# Patient Record
Sex: Male | Born: 1950 | Race: White | Hispanic: No | Marital: Married | Smoking: Former smoker
Health system: Southern US, Community
[De-identification: ages and names within clinical notes are randomized; demographics above are authoritative.]

## PROBLEM LIST (undated history)

## (undated) DIAGNOSIS — K219 Gastro-esophageal reflux disease without esophagitis: Secondary | ICD-10-CM

## (undated) DIAGNOSIS — J189 Pneumonia, unspecified organism: Secondary | ICD-10-CM

## (undated) HISTORY — PX: VASECTOMY: SHX75

## (undated) HISTORY — PX: COLONOSCOPY: SHX174

## (undated) HISTORY — PX: ROTATOR CUFF REPAIR: SHX139

---

## 2006-11-19 ENCOUNTER — Emergency Department: Payer: Self-pay | Admitting: Emergency Medicine

## 2007-01-07 ENCOUNTER — Ambulatory Visit: Payer: Self-pay | Admitting: Internal Medicine

## 2007-01-18 ENCOUNTER — Ambulatory Visit: Payer: Self-pay | Admitting: Internal Medicine

## 2010-11-06 HISTORY — PX: SHOULDER SURGERY: SHX246

## 2011-12-06 ENCOUNTER — Encounter: Payer: Self-pay | Admitting: Internal Medicine

## 2012-09-05 ENCOUNTER — Encounter: Payer: Self-pay | Admitting: Internal Medicine

## 2012-10-11 ENCOUNTER — Ambulatory Visit (AMBULATORY_SURGERY_CENTER): Payer: Medicare FFS

## 2012-10-11 VITALS — Ht 69.75 in | Wt 172.0 lb

## 2012-10-11 DIAGNOSIS — Z860100 Personal history of colon polyps, unspecified: Secondary | ICD-10-CM

## 2012-10-11 MED ORDER — MOVIPREP 100 G PO SOLR: 1.0000 | Freq: Once | ORAL | Status: DC

## 2012-10-17 ENCOUNTER — Ambulatory Visit (AMBULATORY_SURGERY_CENTER): Payer: Medicare FFS | Admitting: Internal Medicine

## 2012-10-17 ENCOUNTER — Encounter: Payer: Self-pay | Admitting: Internal Medicine

## 2012-10-17 VITALS — BP 121/77 | HR 63 | Temp 97.7°F | Resp 13 | Ht 69.75 in | Wt 172.0 lb

## 2012-10-17 DIAGNOSIS — D126 Benign neoplasm of colon, unspecified: Secondary | ICD-10-CM

## 2012-10-17 DIAGNOSIS — Z86010 Personal history of colonic polyps: Secondary | ICD-10-CM

## 2012-10-17 DIAGNOSIS — Z1211 Encounter for screening for malignant neoplasm of colon: Secondary | ICD-10-CM

## 2012-10-17 MED ORDER — SODIUM CHLORIDE 0.9 % IV SOLN: 500.0000 mL | INTRAVENOUS | Status: DC

## 2012-10-17 NOTE — Patient Instructions (Addendum)
YOU HAD AN ENDOSCOPIC PROCEDURE TODAY AT THE [facility]: Refer to the procedure report that was given to you for any specific questions about what was found during the examination.  If the procedure report does not answer your questions, please call your gastroenterologist to clarify.  If you requested that your care partner not be given the details of your procedure findings, then the procedure report has been included in a sealed envelope for you to review at your convenience later.  YOU SHOULD EXPECT: Some feelings of bloating in the abdomen. Passage of more gas than usual.  Walking can help get rid of the air that was put into your GI tract during the procedure and reduce the bloating. If you had a lower endoscopy (such as a colonoscopy or flexible sigmoidoscopy) you may notice spotting of blood in your stool or on the toilet paper. If you underwent a bowel prep for your procedure, then you may not have a normal bowel movement for a few days.  DIET: Your first meal following the procedure should be a light meal and then it is ok to progress to your normal diet.  A half-sandwich or bowl of soup is an example of a good first meal.  Heavy or fried foods are harder to digest and may make you feel nauseous or bloated.  Likewise meals heavy in dairy and vegetables can cause extra gas to form and this can also increase the bloating.  Drink plenty of fluids but you should avoid alcoholic beverages for 24 hours.  ACTIVITY: Your care partner should take you home directly after the procedure.  You should plan to take it easy, moving slowly for the rest of the day.  You can resume normal activity the day after the procedure however you should NOT DRIVE or use heavy machinery for 24 hours (because of the sedation medicines used during the test).    SYMPTOMS TO REPORT IMMEDIATELY: A gastroenterologist can be reached at any hour.  During normal business hours, 8:30 AM to 5:00 PM Monday through Friday,  call [phone].  After hours and on weekends, please call the GI answering service at [phone] who will take a message and have the physician on call contact you.   Following lower endoscopy (colonoscopy or flexible sigmoidoscopy):  Excessive amounts of blood in the stool  Significant tenderness or worsening of abdominal pains  Swelling of the abdomen that is new, acute  Fever of 100F or higher   FOLLOW UP: If any biopsies were taken you will be contacted by phone or by letter within the next 1-3 weeks.  Call your gastroenterologist if you have not heard about the biopsies in 3 weeks.  Our staff will call the home number listed on your records the next business day following your procedure to check on you and address any questions or concerns that you may have at that time regarding the information given to you following your procedure. This is a courtesy call and so if there is no answer at the home number and we have not heard from you through the emergency physician on call, we will assume that you have returned to your regular daily activities without incident.  SIGNATURES/CONFIDENTIALITY: You and/or your care partner have signed paperwork which will be entered into your electronic medical record.  These signatures attest to the fact that that the information above on your After Visit Summary has been reviewed and is understood.  Full responsibility of the confidentiality of   this discharge information lies with you and/or your care-partner.   Resume medications. Information given on polyps,diverticulosis and high fiber diet with discharge instructions. 

## 2012-10-17 NOTE — Op Note (Signed)
[facility] [address] [place], [address]   COLONOSCOPY PROCEDURE REPORT  PATIENT: Charles Velazquez, Charles Velazquez  MR#: [id number] BIRTHDATE: 06/17/1951 , 61  yrs. old GENDER: Male ENDOSCOPIST: Roxy Cedar, MD REFERRED XB:[id number] Program Recall PROCEDURE DATE:  10/17/2012 PROCEDURE:   Colonoscopy with snare polypectomy ASA CLASS:   Class II INDICATIONS:Patient's personal history of adenomatous colon polyps.  MEDICATIONS: MAC sedation, administered by CRNA and propofol (Diprivan) 400mg  IV  DESCRIPTION OF PROCEDURE:   After the risks benefits and alternatives of the procedure were thoroughly explained, informed consent was obtained.  A digital rectal exam revealed no abnormalities of the rectum.   The LB CF-[id number]  endoscope was introduced through the anus and advanced to the cecum, which was identified by both the appendix and ileocecal valve. No adverse events experienced.   The quality of the prep was excellent, using MoviPrep  The instrument was then slowly withdrawn as the colon was fully examined.      COLON FINDINGS: Two polyps measuring 9 and 5 mm in size were found in the transverse colon and rectum respectively.  A polypectomy was performed with a cold snare.  The resection was complete and the polyp tissue was completely retrieved.   Moderate diverticulosis was noted in the sigmoid colon.   The colon mucosa was otherwise normal.  Retroflexed views revealed no abnormalities. The time to cecum=3 minutes 53 seconds.  Withdrawal time=11 minutes 0 seconds. The scope was withdrawn and the procedure completed. COMPLICATIONS: There were no complications.  ENDOSCOPIC IMPRESSION: 1.   Two polyps were found in the transverse colon and rectum; polypectomy was performed with a cold snare 2.   Moderate diverticulosis was noted in the sigmoid colon 3.   The colon mucosa was otherwise normal  RECOMMENDATIONS: 1. Follow up colonoscopy in 5  years   eSigned:  Roxy Cedar, MD 10/17/2012 3:50 PM   cc: Robert Bellow, MD and The Patient   PATIENT NAME:  Charles Velazquez, Charles Velazquez MR#: [id number]

## 2012-10-17 NOTE — Progress Notes (Signed)
Propofol given over incremental dosages 

## 2012-10-17 NOTE — Progress Notes (Signed)
Patient did not experience any of the following events: a burn prior to discharge; a fall within the facility; wrong site/side/patient/procedure/implant event; or a hospital transfer or hospital admission upon discharge from the facility. (G8907) Patient did not have preoperative order for IV antibiotic SSI prophylaxis. (G8918)  

## 2012-10-17 NOTE — Progress Notes (Signed)
Called to room to assist during endoscopic procedure.  Patient ID and intended procedure confirmed with present staff. Received instructions for my participation in the procedure from the performing physician. ewm 

## 2012-10-18 ENCOUNTER — Telehealth: Payer: Self-pay

## 2012-10-18 NOTE — Telephone Encounter (Signed)
Left message on answering machine. 

## 2012-10-23 ENCOUNTER — Encounter: Payer: Self-pay | Admitting: Internal Medicine

## 2012-11-23 ENCOUNTER — Ambulatory Visit (INDEPENDENT_AMBULATORY_CARE_PROVIDER_SITE_OTHER): Payer: Managed Care, Other (non HMO) | Admitting: Family Medicine

## 2012-11-23 ENCOUNTER — Ambulatory Visit: Payer: Managed Care, Other (non HMO)

## 2012-11-23 VITALS — BP 131/80 | HR 70 | Temp 98.1°F | Resp 18 | Ht 69.0 in | Wt 169.6 lb

## 2012-11-23 DIAGNOSIS — R05 Cough: Secondary | ICD-10-CM

## 2012-11-23 DIAGNOSIS — R059 Cough, unspecified: Secondary | ICD-10-CM

## 2012-11-23 DIAGNOSIS — J189 Pneumonia, unspecified organism: Secondary | ICD-10-CM

## 2012-11-23 DIAGNOSIS — J40 Bronchitis, not specified as acute or chronic: Secondary | ICD-10-CM

## 2012-11-23 LAB — POCT CBC
Granulocyte percent: 73 %G (ref 37–80)
HCT, POC: 46.9 % (ref 43.5–53.7)
Hemoglobin: 14.4 g/dL (ref 14.1–18.1)
Lymph, poc: 2 (ref 0.6–3.4)
MCH, POC: 29.8 pg (ref 27–31.2)
MCHC: 30.7 g/dL — AB (ref 31.8–35.4)
MCV: 97.2 fL — AB (ref 80–97)
MID (cbc): 0.7 (ref 0–0.9)
MPV: 8.2 fL (ref 0–99.8)
POC Granulocyte: 7.2 — AB (ref 2–6.9)
POC LYMPH PERCENT: 19.8 %L (ref 10–50)
POC MID %: 7.2 %M (ref 0–12)
Platelet Count, POC: 397 10*3/uL (ref 142–424)
RBC: 4.83 M/uL (ref 4.69–6.13)
RDW, POC: 12.9 %
WBC: 9.9 10*3/uL (ref 4.6–10.2)

## 2012-11-23 MED ORDER — HYDROCODONE-HOMATROPINE 5-1.5 MG/5ML PO SYRP: 5.0000 mL | ORAL_SOLUTION | Freq: Three times a day (TID) | ORAL | Status: DC | PRN

## 2012-11-23 MED ORDER — AZITHROMYCIN 250 MG PO TABS: ORAL_TABLET | ORAL | Status: DC

## 2012-11-23 MED ORDER — PREDNISONE 20 MG PO TABS: ORAL_TABLET | ORAL | Status: DC

## 2012-11-23 NOTE — Patient Instructions (Addendum)

## 2012-11-23 NOTE — Progress Notes (Signed)
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 First he thought was an upper respiratory infection and did not need much in with medicine. About 5 days ago he developed intermittent fever and worsening cough which has prompted him to quit smoking. The cough is now fairly violent, he's short of breath, and tired. The fever has stopped of the last 3 days. Is not coughing much phlegm up and he seen no hemoptysis.  Objective: Patient appears ill but in no acute distress. He is cooperative and has good eye contact HEENT: Unremarkable Neck: Supple no adenopathy Chest: Bibasilar rales and rhonchi Heart: Regular without murmur Extremities: No edema [facility] reading (PRIMARY) by  Dr. Milus Glazier:  CXR  Flattened diaphragms c/w COPD Results for orders placed in visit on 11/23/12  POCT CBC      Component Value Range   WBC 9.9  4.6 - 10.2 K/uL   Lymph, poc 2.0  0.6 - 3.4   POC LYMPH PERCENT 19.8  10 - 50 %L   MID (cbc) 0.7  0 - 0.9   POC MID % 7.2  0 - 12 %M   POC Granulocyte 7.2 (*) 2 - 6.9   Granulocyte percent 73.0  37 - 80 %G   RBC 4.83  4.69 - 6.13 M/uL   Hemoglobin 14.4  14.1 - 18.1 g/dL   HCT, POC 16.1  09.6 - 53.7 %   MCV 97.2 (*) 80 - 97 fL   MCH, POC 29.8  27 - 31.2 pg   MCHC 30.7 (*) 31.8 - 35.4 g/dL   RDW, POC 04.5     Platelet Count, POC 397  142 - 424 K/uL   MPV 8.2  0 - 99.8 fL  \ .  Assessment:  COPD with bronchitis, acute.  Worsening.  Judging by the x-ray and lung sounds, I suspect the patient has an early pneumonia.  1. Cough  DG Chest 2 View, POCT CBC, azithromycin (ZITHROMAX Z-PAK) 250 MG tablet, predniSONE (DELTASONE) 20 MG tablet, HYDROcodone-homatropine (HYCODAN) 5-1.5 MG/5ML syrup  2. Bronchitis  azithromycin (ZITHROMAX Z-PAK) 250 MG tablet, predniSONE (DELTASONE) 20 MG tablet, HYDROcodone-homatropine (HYCODAN) 5-1.5 MG/5ML syrup  3. Pneumonia  azithromycin (ZITHROMAX Z-PAK) 250 MG tablet   Return INB in 48 hours, sooner if worsening

## 2012-12-04 ENCOUNTER — Ambulatory Visit: Payer: Managed Care, Other (non HMO)

## 2012-12-04 ENCOUNTER — Ambulatory Visit (INDEPENDENT_AMBULATORY_CARE_PROVIDER_SITE_OTHER): Payer: Managed Care, Other (non HMO) | Admitting: Emergency Medicine

## 2012-12-04 VITALS — BP 149/78 | HR 79 | Temp 98.6°F | Resp 16 | Ht 69.0 in | Wt 169.9 lb

## 2012-12-04 DIAGNOSIS — R918 Other nonspecific abnormal finding of lung field: Secondary | ICD-10-CM

## 2012-12-04 DIAGNOSIS — R9389 Abnormal findings on diagnostic imaging of other specified body structures: Secondary | ICD-10-CM

## 2012-12-04 DIAGNOSIS — F17200 Nicotine dependence, unspecified, uncomplicated: Secondary | ICD-10-CM

## 2012-12-04 NOTE — Progress Notes (Signed)
  Subjective:    Patient ID: Charles Velazquez, male    DOB: [birth year], 62 y.o.   MRN: [id number]  HPI patient seen here with a respiratory illness which was treated with prednisone Hycodan and Zithromax. His chest x-ray showed a questionable nodular area. HEENT it was requested that he come in for repeat chest x-ray with nipple markers. He is a one pack per day smoker.    Review of Systems     Objective:   Physical Exam his chest is clear to auscultation and percussion today. He does not appear ill.  [facility] reading (PRIMARY) by  Dr. Cleta Alberts I do not see the nodule on this view.        Assessment & Plan:  Patient is doing well from his respiratory illness. He was again encouraged to not smoke. I sent his chest x-ray to the radiologist for over read. If the reading on his chest x-ray is normal I would suggest a repeat chest x-ray in 3-6 months just to be 100% sure there is nothing active in that area.

## 2012-12-13 ENCOUNTER — Telehealth: Payer: Self-pay | Admitting: Radiology

## 2012-12-13 NOTE — Telephone Encounter (Signed)
Message copied by Caffie Damme on Fri Dec 13, 2012 10:45 AM ------      Message from: Lesle Chris A      Created: Wed Dec 04, 2012 12:45 PM       Please call patient and let him know that they do feel the nodular density is a nipple shadow. I would recommend he have a chest x-ray in 6 months just to be absolutely sure

## 2012-12-13 NOTE — Telephone Encounter (Signed)
Called patient to advise  °

## 2013-06-10 ENCOUNTER — Other Ambulatory Visit: Payer: Managed Care, Other (non HMO) | Admitting: Internal Medicine

## 2013-06-10 DIAGNOSIS — Z125 Encounter for screening for malignant neoplasm of prostate: Secondary | ICD-10-CM

## 2013-06-10 DIAGNOSIS — Z130 Encounter for screening for diseases of the blood and blood-forming organs and certain disorders involving the immune mechanism: Secondary | ICD-10-CM

## 2013-06-10 DIAGNOSIS — Z0000 Encounter for general adult medical examination without abnormal findings: Secondary | ICD-10-CM

## 2013-06-10 DIAGNOSIS — Z13220 Encounter for screening for lipoid disorders: Secondary | ICD-10-CM

## 2013-06-10 LAB — CBC WITH DIFFERENTIAL/PLATELET
Basophils Absolute: 0 10*3/uL (ref 0.0–0.1)
Eosinophils Relative: 4 % (ref 0–5)
Lymphocytes Relative: 30 % (ref 12–46)
Neutro Abs: 3.6 10*3/uL (ref 1.7–7.7)
Neutrophils Relative %: 58 % (ref 43–77)
Platelets: 222 10*3/uL (ref 150–400)
RDW: 14.1 % (ref 11.5–15.5)
WBC: 6.1 10*3/uL (ref 4.0–10.5)

## 2013-06-11 LAB — COMPREHENSIVE METABOLIC PANEL
ALT: 13 U/L (ref 0–53)
AST: 27 U/L (ref 0–37)
Calcium: 9 mg/dL (ref 8.4–10.5)
Chloride: 104 mEq/L (ref 96–112)
Creat: 1.01 mg/dL (ref 0.50–1.35)
Potassium: 4.1 mEq/L (ref 3.5–5.3)
Sodium: 138 mEq/L (ref 135–145)

## 2013-06-11 LAB — LIPID PANEL
HDL: 57 mg/dL (ref >39)
LDL Cholesterol: 98 mg/dL (ref 0–99)

## 2013-06-16 ENCOUNTER — Ambulatory Visit (INDEPENDENT_AMBULATORY_CARE_PROVIDER_SITE_OTHER): Payer: Managed Care, Other (non HMO) | Admitting: Internal Medicine

## 2013-06-16 ENCOUNTER — Encounter: Payer: Self-pay | Admitting: Internal Medicine

## 2013-06-16 VITALS — BP 142/78 | HR 80 | Temp 98.7°F | Ht 68.75 in | Wt 171.0 lb

## 2013-06-16 DIAGNOSIS — Z0000 Encounter for general adult medical examination without abnormal findings: Secondary | ICD-10-CM

## 2013-06-16 DIAGNOSIS — Z8709 Personal history of other diseases of the respiratory system: Secondary | ICD-10-CM | POA: Insufficient documentation

## 2013-06-16 DIAGNOSIS — Z87891 Personal history of nicotine dependence: Secondary | ICD-10-CM

## 2013-06-16 LAB — POCT URINALYSIS DIPSTICK
Bilirubin, UA: NEGATIVE
Ketones, UA: NEGATIVE
Leukocytes, UA: NEGATIVE
Protein, UA: NEGATIVE
Spec Grav, UA: 1.01
pH, UA: 7

## 2013-07-07 ENCOUNTER — Encounter: Payer: Self-pay | Admitting: Internal Medicine

## 2013-07-07 NOTE — Patient Instructions (Addendum)
Please quit smoking. May use albuterol inhaler for cough as needed. Return in one year or as needed.

## 2013-07-07 NOTE — Progress Notes (Signed)
Subjective:    Patient ID: Charles Velazquez, male    DOB: 1951-01-24, 62 y.o.   MRN: [id number]  HPI First visit for this pleasant 62 year old white male referred by Parke Poisson for health maintenance and evaluation of medical issues.   No known drug allergies  No chronic medications  Had tetanus immunization 2012  History of colonoscopy by Dr. Marina Goodell with hyperplastic and adenomatous polyps December 2013.  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 He is a native of [place] but raised in [place] and attended high school in [place] before moving to [place]. Moved to [facility] 1978 with wife and 1-year-old daughter. Long-standing history of heavy smoking a pack per day for 40 years. Drinks beer generally about 32 ounces daily. Married. Wife employed by [employer]  Family history: Both parents age 81 living in good health. One brother age 60 with history of gout. 2 sisters in good health. 3 adult daughters in good health.  Patient was seen in urgent care Center January 20 14th with cough. Chest x-ray raise question of possible abnormal chest x-ray with lung nodule. Have her chest x-ray was repeated with nipple markers and area in question corresponded with nipple marker    Review of Systems  Constitutional: Negative.   HENT: Negative.   Eyes: Negative.   Respiratory:       Some cough and shortness of breath  Cardiovascular: Negative.   Gastrointestinal: Negative.   Endocrine: Negative.   Genitourinary: Negative.   Musculoskeletal:       Minor aches and pains  Allergic/Immunologic: Negative.   Neurological: Negative.   Hematological: Negative.   Psychiatric/Behavioral: Negative.        Objective:   Physical Exam  Vitals  reviewed. Constitutional: He is oriented to person, place, and time. He appears well-developed and well-nourished. No distress.  HENT:  Head: Normocephalic and atraumatic.  Right Ear: External ear normal.  Left Ear: External ear normal.  Mouth/Throat: Oropharynx is clear and moist. No oropharyngeal exudate.  Eyes: Conjunctivae and EOM are normal. Right eye exhibits no discharge. Left eye exhibits no discharge.  Neck: Neck supple. No JVD present. No tracheal deviation present. No thyromegaly present.  Cardiovascular: Normal rate, regular rhythm, normal heart sounds and intact distal pulses.   No murmur heard. Pulmonary/Chest: Effort normal and breath sounds normal. No respiratory distress. He has no wheezes. He has no rales. He exhibits no tenderness.  Abdominal: Soft. Bowel sounds are normal. He exhibits no distension and no mass. There is no tenderness. There is no rebound and no guarding.  Genitourinary: Prostate normal.  Musculoskeletal: Normal range of motion. He exhibits no edema.  Lymphadenopathy:    He has no cervical adenopathy.  Neurological: He is oriented to person, place, and time. He has normal reflexes. He displays normal reflexes. No cranial nerve deficit. Coordination normal.  Skin: Skin is warm and dry. No rash noted. He is not diaphoretic.  Psychiatric: He has a normal mood and affect. His behavior is normal. Judgment and thought content normal.          Assessment & Plan:  History of smoking  COPD  History of adenomatous colon polyps on colonoscopy 2013  Plan: Patient willing to try Chantix. Prescription written. Has chronic cough consistent with COPD/chronic bronchitis. Recommend albuterol inhaler 2 sprays by  mouth 4 times daily as needed

## 2014-04-16 ENCOUNTER — Ambulatory Visit (INDEPENDENT_AMBULATORY_CARE_PROVIDER_SITE_OTHER): Payer: Managed Care, Other (non HMO) | Admitting: Family Medicine

## 2014-04-16 VITALS — BP 128/82 | HR 81 | Temp 97.9°F | Resp 16 | Ht 69.0 in | Wt 172.4 lb

## 2014-04-16 DIAGNOSIS — M542 Cervicalgia: Secondary | ICD-10-CM

## 2014-04-16 DIAGNOSIS — IMO0001 Reserved for inherently not codable concepts without codable children: Secondary | ICD-10-CM

## 2014-04-16 DIAGNOSIS — B9789 Other viral agents as the cause of diseases classified elsewhere: Secondary | ICD-10-CM

## 2014-04-16 DIAGNOSIS — M791 Myalgia, unspecified site: Secondary | ICD-10-CM

## 2014-04-16 DIAGNOSIS — R51 Headache: Secondary | ICD-10-CM

## 2014-04-16 DIAGNOSIS — B349 Viral infection, unspecified: Secondary | ICD-10-CM

## 2014-04-16 LAB — POCT CBC
GRANULOCYTE PERCENT: 59.2 % (ref 37–80)
HEMATOCRIT: 50.1 % (ref 43.5–53.7)
HEMOGLOBIN: 16.1 g/dL (ref 14.1–18.1)
LYMPH, POC: 1 (ref 0.6–3.4)
MCH, POC: 31.4 pg — AB (ref 27–31.2)
MCHC: 32.1 g/dL (ref 31.8–35.4)
MCV: 97.8 fL — AB (ref 80–97)
MID (cbc): 0.3 (ref 0–0.9)
MPV: 8.4 fL (ref 0–99.8)
POC GRANULOCYTE: 2 (ref 2–6.9)
POC LYMPH PERCENT: 31.8 %L (ref 10–50)
POC MID %: 9 %M (ref 0–12)
Platelet Count, POC: 194 10*3/uL (ref 142–424)
RBC: 5.12 M/uL (ref 4.69–6.13)
RDW, POC: 13.7 %
WBC: 3.3 10*3/uL — AB (ref 4.6–10.2)

## 2014-04-16 MED ORDER — HYDROCODONE-ACETAMINOPHEN 5-325 MG PO TABS: ORAL_TABLET | ORAL | Status: DC

## 2014-04-16 MED ORDER — DICLOFENAC SODIUM 75 MG PO TBEC: DELAYED_RELEASE_TABLET | ORAL | Status: DC

## 2014-04-16 NOTE — Addendum Note (Signed)
Addended by: Ivor Reining on: 04/16/2014 10:20 AM   Modules accepted: Orders

## 2014-04-16 NOTE — Progress Notes (Addendum)
Subjective: Patient started feeling bad 4 days ago, Sunday night. He developed a headache and neck pain and severe generalized aching. He felt lousy all over. No dizziness. No nausea or vomiting. No diarrhea or constipation except he had some constipation earlier in the week. He did not have any cold or cough or sore throat. The head is continued intermittently having severe pain. There is a quite in the right upper neck that he can touch which has been severely tender. He has done just a little bit of work this week. He works Architect. He was bothering him bad enough again last night and decide him, and this morning. He still is smaller.  Objective: Pleasant alert gentleman. TMs are normal. Eyes PERRLA. Fundi benign. EOMs intact. Throat clear. Neck supple without nodes. No carotid bruits. Cranial nerves 2-12 grossly intact. He does have a tender spot in the neck to the right of the C-spine and about the C1-2 level. Chest clear. Heart regular without murmurs. Abdomen soft the mass or tenderness. Strength is good.  Assessment: Headache Cervical pain Generalized malaise and achiness  Plan: Check CBC  Results for orders placed in visit on 04/16/14  POCT CBC      Result Value Ref Range   WBC 3.3 (*) 4.6 - 10.2 K/uL   Lymph, poc 1.0  0.6 - 3.4   POC LYMPH PERCENT 31.8  10 - 50 %L   MID (cbc) 0.3  0 - 0.9   POC MID % 9.0  0 - 12 %M   POC Granulocyte 2.0  2 - 6.9   Granulocyte percent 59.2  37 - 80 %G   RBC 5.12  4.69 - 6.13 M/uL   Hemoglobin 16.1  14.1 - 18.1 g/dL   HCT, POC 50.1  43.5 - 53.7 %   MCV 97.8 (*) 80 - 97 fL   MCH, POC 31.4 (*) 27 - 31.2 pg   MCHC 32.1  31.8 - 35.4 g/dL   RDW, POC 13.7     Platelet Count, POC 194  142 - 424 K/uL   MPV 8.4  0 - 99.8 fL   My computer is not printing prescriptions this morning so we had to read print thehydrocodone prescription. It looks like to prescriptions were given but only one was.

## 2014-11-06 ENCOUNTER — Ambulatory Visit (INDEPENDENT_AMBULATORY_CARE_PROVIDER_SITE_OTHER): Payer: Managed Care, Other (non HMO)

## 2014-11-06 ENCOUNTER — Ambulatory Visit (INDEPENDENT_AMBULATORY_CARE_PROVIDER_SITE_OTHER): Payer: Managed Care, Other (non HMO) | Admitting: Family Medicine

## 2014-11-06 VITALS — BP 154/86 | HR 85 | Temp 97.9°F | Resp 18 | Ht 68.75 in | Wt 179.2 lb

## 2014-11-06 DIAGNOSIS — R0781 Pleurodynia: Secondary | ICD-10-CM

## 2014-11-06 DIAGNOSIS — F17200 Nicotine dependence, unspecified, uncomplicated: Secondary | ICD-10-CM

## 2014-11-06 DIAGNOSIS — S2232XA Fracture of one rib, left side, initial encounter for closed fracture: Secondary | ICD-10-CM

## 2014-11-06 DIAGNOSIS — Z720 Tobacco use: Secondary | ICD-10-CM

## 2014-11-06 DIAGNOSIS — J449 Chronic obstructive pulmonary disease, unspecified: Secondary | ICD-10-CM

## 2014-11-06 MED ORDER — OXYCODONE-ACETAMINOPHEN 5-325 MG PO TABS: 1.0000 | ORAL_TABLET | ORAL | Status: DC | PRN

## 2014-11-06 MED ORDER — MELOXICAM 15 MG PO TABS: 15.0000 mg | ORAL_TABLET | Freq: Every day | ORAL | Status: DC

## 2014-11-06 NOTE — Patient Instructions (Signed)

## 2014-11-06 NOTE — Progress Notes (Signed)
Subjective:    Patient ID: Charles Velazquez, male    DOB: 1951/06/22, 64 y.o.   MRN: [id number] Chief Complaint  Patient presents with  . Chest Pain    left rib pain. slipped on Monday and hit the left side of his ribs  . Shortness of Breath    HPI  On Monday - 5 d prev - pt was in the bed of his pick-up truck when he fell and landed on his side hitting his left lower lateral chest. Has had similar rib injuries in that area prior so was trying to take ibuprofen bid but today when he woke up the pain was much worse than initial and he felt short of breath.  He has not taken any medications today.  Has some swelling and a little bruising over the area.   No past medical history on file. Current Outpatient Prescriptions on File Prior to Visit  Medication Sig Dispense Refill  . aspirin 325 MG tablet Take 325 mg by mouth every 6 (six) hours as needed for pain.    Marland Kitchen diclofenac (VOLTAREN) 75 MG EC tablet Take one twice daily for neck pain and inflammation (Patient not taking: Reported on 11/06/2014) 20 tablet 0  . Multiple Vitamin (MULTIVITAMIN) tablet Take 1 tablet by mouth daily.     No current facility-administered medications on file prior to visit.   No Known Allergies   Review of Systems  Constitutional: Positive for activity change, appetite change and fatigue. Negative for fever and chills.  HENT: Negative for congestion, ear discharge, ear pain, postnasal drip, rhinorrhea, sinus pressure, sneezing, sore throat and trouble swallowing.   Respiratory: Positive for cough, chest tightness, shortness of breath and wheezing.   Cardiovascular: Positive for chest pain.  Gastrointestinal: Negative for nausea, vomiting, abdominal pain, diarrhea and constipation.  Genitourinary: Negative for dysuria and difficulty urinating.  Musculoskeletal: Positive for myalgias and arthralgias.  Skin: Positive for color change. Negative for rash and wound.  Hematological: Negative for adenopathy.    Psychiatric/Behavioral: Positive for sleep disturbance.       Objective:  BP 154/86 mmHg  Pulse 85  Temp(Src) 97.9 F (36.6 C) (Oral)  Resp 18  Ht 5' 8.75" (1.746 m)  Wt 179 lb 3.2 oz (81.285 kg)  BMI 26.66 kg/m2  SpO2 95%  Physical Exam  Constitutional: He is oriented to person, place, and time. He appears well-developed and well-nourished. No distress.  HENT:  Head: Normocephalic and atraumatic.  Eyes: Pupils are equal, round, and reactive to light.  Cardiovascular: Normal rate, regular rhythm, normal heart sounds and intact distal pulses.   No murmur heard. Pulmonary/Chest: Effort normal and breath sounds normal. No respiratory distress. He has no wheezes. He has no rales. He exhibits tenderness.  Musculoskeletal: Normal range of motion. He exhibits tenderness. He exhibits no edema.  Neurological: He is alert and oriented to person, place, and time. He has normal strength. He displays no atrophy. No sensory deficit. He exhibits normal muscle tone. Coordination and gait normal.  Skin: Skin is warm and dry. No rash noted. He is not diaphoretic. No erythema.  Psychiatric: He has a normal mood and affect. His behavior is normal.         [facility] reading (PRIMARY) by  Dr. Brigitte Pulse. CXR: No acute abnormality; underlying copd with coarsening of interstitial marking bilaterally Left lateral rib films: no rib fracture seen  Dg Chest 2 View  11/06/2014   CLINICAL DATA:  Shortness of breath, smoker.  EXAM: CHEST  2 VIEW  COMPARISON:  12/04/2012  FINDINGS: There is hyperinflation of the lungs compatible with COPD. Heart and mediastinal contours are within normal limits. No focal opacities or effusions. No acute bony abnormality.  IMPRESSION: COPD.  No active disease.   Electronically Signed   By: Rolm Baptise M.D.   On: 11/06/2014 10:19   Dg Ribs Unilateral W/chest Left  11/06/2014   CLINICAL DATA:  Left-sided rib pain  EXAM: LEFT RIBS AND CHEST - 3+ VIEW  COMPARISON:  11/06/2014 Chest x-ray   FINDINGS: The cardiac silhouette, mediastinal and hilar contours are within normal limits and stable. There is tortuosity and calcification of the thoracic aorta. The lungs demonstrate chronic emphysematous and bronchitic changes without acute overlying pulmonary process. No pleural effusion or pneumothorax.  Dedicated views of the left ribs demonstrate remote appearing rib fractures but no definite acute fracture.  IMPRESSION: No acute cardiopulmonary findings.  No definite acute left-sided rib fractures.   Electronically Signed   By: Kalman Jewels M.D.   On: 11/06/2014 10:24    Assessment & Plan:   Tobacco use disorder - pt appears to have copd per xray. Encouraged cessation.  Rib pain on left side - Plan: DG Chest 2 View, DG Ribs Unilateral W/Chest Left - Pt offered toradol inj for relief in office when he declined.  Left rib fracture, closed, initial encounter - suspect contusion - NO fracture. Warned of complications of copd exac or pneumonia so enc pulm toilet and low threshhold to RTC for any illness with cough.  Treat symptomatically - recheck if pain is not improving.  Meds ordered this encounter  Medications  . oxyCODONE-acetaminophen (ROXICET) 5-325 MG per tablet    Sig: Take 1-2 tablets by mouth every 4 (four) hours as needed for severe pain.    Dispense:  40 tablet    Refill:  0  . meloxicam (MOBIC) 15 MG tablet    Sig: Take 1 tablet (15 mg total) by mouth daily.    Dispense:  30 tablet    Refill:  1    I personally performed the services described in this documentation, which was scribed in my presence. The recorded information has been reviewed and considered, and addended by me as needed.  Delman Cheadle, MD MPH

## 2014-11-14 DIAGNOSIS — F17200 Nicotine dependence, unspecified, uncomplicated: Secondary | ICD-10-CM | POA: Insufficient documentation

## 2014-11-14 DIAGNOSIS — J449 Chronic obstructive pulmonary disease, unspecified: Secondary | ICD-10-CM | POA: Insufficient documentation

## 2014-12-21 ENCOUNTER — Encounter (HOSPITAL_COMMUNITY): Payer: Self-pay

## 2014-12-21 ENCOUNTER — Emergency Department (HOSPITAL_COMMUNITY)
Admission: EM | Admit: 2014-12-21 | Discharge: 2014-12-21 | Disposition: A | Discharge status: Home / Self Care | Payer: Medicare FFS | Attending: Emergency Medicine | Admitting: Emergency Medicine

## 2014-12-21 ENCOUNTER — Emergency Department (HOSPITAL_COMMUNITY): Payer: Medicare FFS

## 2014-12-21 DIAGNOSIS — W001XXA Fall from stairs and steps due to ice and snow, initial encounter: Secondary | ICD-10-CM | POA: Diagnosis not present

## 2014-12-21 DIAGNOSIS — S76112A Strain of left quadriceps muscle, fascia and tendon, initial encounter: Secondary | ICD-10-CM | POA: Diagnosis not present

## 2014-12-21 DIAGNOSIS — Z791 Long term (current) use of non-steroidal anti-inflammatories (NSAID): Secondary | ICD-10-CM | POA: Insufficient documentation

## 2014-12-21 DIAGNOSIS — Y9389 Activity, other specified: Secondary | ICD-10-CM | POA: Diagnosis not present

## 2014-12-21 DIAGNOSIS — Z7982 Long term (current) use of aspirin: Secondary | ICD-10-CM | POA: Diagnosis not present

## 2014-12-21 DIAGNOSIS — Z79899 Other long term (current) drug therapy: Secondary | ICD-10-CM | POA: Diagnosis not present

## 2014-12-21 DIAGNOSIS — Z720 Tobacco use: Secondary | ICD-10-CM | POA: Insufficient documentation

## 2014-12-21 DIAGNOSIS — Y9289 Other specified places as the place of occurrence of the external cause: Secondary | ICD-10-CM | POA: Insufficient documentation

## 2014-12-21 DIAGNOSIS — Y998 Other external cause status: Secondary | ICD-10-CM | POA: Diagnosis not present

## 2014-12-21 DIAGNOSIS — M79606 Pain in leg, unspecified: Secondary | ICD-10-CM

## 2014-12-21 DIAGNOSIS — S8992XA Unspecified injury of left lower leg, initial encounter: Secondary | ICD-10-CM | POA: Diagnosis present

## 2014-12-21 MED ORDER — OXYCODONE-ACETAMINOPHEN 5-325 MG PO TABS
1.0000 | ORAL_TABLET | Freq: Once | ORAL | Status: AC
  Administered 2014-12-21: 1 via ORAL
  Filled 2014-12-21: qty 1

## 2014-12-21 MED ORDER — OXYCODONE-ACETAMINOPHEN 5-325 MG PO TABS: 1.0000 | ORAL_TABLET | ORAL | Status: DC | PRN

## 2014-12-21 NOTE — ED Notes (Signed)
MD in room

## 2014-12-21 NOTE — ED Provider Notes (Signed)
CSN: [id number]     Arrival date & time 12/21/14  2012 History   None    Chief Complaint  Patient presents with  . Knee Injury     (Consider location/radiation/quality/duration/timing/severity/associated sxs/prior Treatment) HPI Comments: 64 yo M no significant PMhx presents with CC of left knee pain.  Pt states he slipped on ice 30 minutes prior to arrival, and his left knee "gave out", he felt a "pop", and had pain in left knee.  Unable to bear weight or ambulated since the accident.  Denies any other injuries, paresthesias, focal deficits.  Denies previous occurrence.  Denies taken meds prior to arrival.  No other concerns at this time.    The history is provided by the patient. No language interpreter was used.    History reviewed. No pertinent past medical history. Past Surgical History  Procedure Laterality Date  . Rotator cuff repair      left   . Shoulder surgery  2012   Family History  Problem Relation Age of Onset  . Renal cancer Brother   . Colon cancer Neg Hx    History  Substance Use Topics  . Smoking status: Current Every Day Smoker -- 1.00 packs/day for 45 years    Types: Cigarettes    Start date: 06/17/1967  . Smokeless tobacco: Never Used  . Alcohol Use: Yes     Comment: 2-3 beers/ day    Review of Systems  Musculoskeletal: Positive for myalgias, joint swelling, arthralgias and gait problem. Negative for back pain and neck pain.  Skin: Negative for rash and wound.  Neurological: Negative for weakness and numbness.  All other systems reviewed and are negative.     Allergies  Review of patient's allergies indicates no known allergies.  Home Medications   Prior to Admission medications   Medication Sig Start Date End Date Taking? Authorizing Provider  aspirin 325 MG tablet Take 325 mg by mouth every 6 (six) hours as needed for pain.    Historical Provider, MD  diclofenac (VOLTAREN) 75 MG EC tablet Take one twice daily for neck pain and  inflammation Patient not taking: Reported on 11/06/2014 04/16/14   Posey Boyer, MD  meloxicam (MOBIC) 15 MG tablet Take 1 tablet (15 mg total) by mouth daily. 11/06/14   Shawnee Knapp, MD  Multiple Vitamin (MULTIVITAMIN) tablet Take 1 tablet by mouth daily.    Historical Provider, MD  oxyCODONE-acetaminophen (ROXICET) 5-325 MG per tablet Take 1-2 tablets by mouth every 4 (four) hours as needed for severe pain. 11/06/14   Shawnee Knapp, MD   There were no vitals taken for this visit. Physical Exam  Constitutional: He is oriented to person, place, and time. He appears well-developed and well-nourished.  HENT:  Head: Normocephalic and atraumatic.  Right Ear: External ear normal.  Left Ear: External ear normal.  Mouth/Throat: Oropharynx is clear and moist.  Eyes: Conjunctivae and EOM are normal. Pupils are equal, round, and reactive to light.  Neck: Normal range of motion. Neck supple.  Cardiovascular: Normal rate, regular rhythm, normal heart sounds and intact distal pulses.   Pulmonary/Chest: Effort normal and breath sounds normal. No respiratory distress. He has no wheezes. He has no rales. He exhibits no tenderness.  Abdominal: Soft. Bowel sounds are normal. He exhibits no distension and no mass. There is no tenderness. There is no rebound and no guarding.  Musculoskeletal: He exhibits edema and tenderness.  Pt with TTP and edema of left knee, and TTP just above  knee along quadriceps tendon.  No palpable deformity.  Pt able to fully extend knee.  Some guarding on flexion.   Neurological: He is alert and oriented to person, place, and time.  No focal motor or sensory deficits on exam.    Skin: Skin is warm and dry.  Nursing note and vitals reviewed.   ED Course  Procedures (including critical care time) Labs Review Labs Reviewed - No data to display  Imaging Review Dg Knee 2 Views Left  12/21/2014   CLINICAL DATA:  Status post fall down 6 steps today. Left knee pain. Initial encounter.  EXAM:  LEFT KNEE - 1-2 VIEW  COMPARISON:  None.  FINDINGS: No acute bony or joint abnormality is identified. Spurring off the superior pole of the patella is noted. Small calcifications projecting in the quadriceps tendon are compatible with prior inflammatory change or less likely injury. There is no joint effusion. Joint spaces are preserved.  IMPRESSION: No acute abnormality.   Electronically Signed   By: Inge Rise M.D.   On: 12/21/2014 21:25   Dg Femur Min 2 Views Left  12/21/2014   CLINICAL DATA:  [place] down 6 steps today, LEFT knee injury and pain, leg gave out when attempting to walk afterwards, knee buckled  EXAM: LEFT FEMUR 2 VIEWS  COMPARISON:  None  FINDINGS: LEFT hip joint space preserved.  Mild diffuse joint space narrowing of LEFT knee joint.  Calcifications at distal quadriceps tendon and at quadriceps tendon insertion at patella.  Quadriceps tendon appears thickened cranial to the patella, could represent acute or chronic quadriceps tendon injury.  No acute fracture, dislocation or bone destruction.  No definite knee joint effusion.  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  No definite acute fracture or dislocation.  Unable to exclude acute on chronic quadriceps tendon injury; if this is a clinical concern, recommend MR followup.   Electronically Signed   By: Lavonia Dana M.D.   On: 12/21/2014 21:26     EKG Interpretation None      MDM   Final diagnoses:  Leg pain  Quadriceps tendon rupture, left, initial encounter   64 yo M no significant PMhx presents with CC of left knee pain.   Physical exam as above.  VS WNL.  Pt with TTP left knee, and along quad tendon.  XR demonstrates findings concerning for quad tendon rupture.  No other fractures noted.  Pt given Norco for pain.    Pt placed in knee brace, and crutches.  Rx for percocet.  Will have pt f/u with Orthopedics as outpatient.   Sinda Du  I discussed with my attending Dr.  Vanita Panda.     Sinda Du, MD 12/22/14 [id number]  Carmin Muskrat, MD 12/23/14 [phone]

## 2014-12-21 NOTE — Discharge Instructions (Signed)
Tendon Injury Tendons are strong, cordlike structures that connect muscle to bone. Tendons are made up of woven fibers, like a rope. A tendon injury is a tear (rupture) of the tendon. The rupture may be partial (only a few of the fibers in your tendon rupture) or complete (your entire tendon ruptures). CAUSES  Tendon injuries can be caused by high-stress activities, such as sports. They also can be caused by a repetitive injury or by a single injury from an excessive, rapid force. SYMPTOMS  Symptoms of tendon injury include pain when you move the joint close to the tendon. Other symptoms are swelling, redness, and warmth. DIAGNOSIS  Tendon injuries often can be diagnosed by physical exam. However, sometimes an X-ray exam or advanced imaging, such as magnetic resonance imaging (MRI), is necessary to determine the extent of the injury. TREATMENT  Partial tendon ruptures often can be treated with immobilization. A splint, bandage, or removable brace usually is used to immobilize the injured tendon. Most injured tendons need to be immobilized for 1-2 months before they are completely healed. Complete tendon ruptures may require surgical reattachment. Document Released: 11/30/2004 Document Revised: 10/12/2011 Document Reviewed: 01/14/2012 [facility] Patient Information 2015 [place], [place]. This information is not intended to replace advice given to you by your health care provider. Make sure you discuss any questions you have with your health care provider.

## 2014-12-21 NOTE — Progress Notes (Signed)
Orthopedic Tech Progress Note Patient Details:  Charles Velazquez 01/29/1951 [id number]  Ortho Devices Type of Ortho Device: Knee Immobilizer, Knee Sleeve, Crutches Ortho Device/Splint Location: LLE Ortho Device/Splint Interventions: Ordered, Application   Braulio Bosch 12/21/2014, 10:35 PM

## 2014-12-21 NOTE — ED Notes (Addendum)
Pt stated that he slipped while taking the trash out on the steps and his left knee gave out and he fell down the steps. Pt denies loc or hitting head.

## 2014-12-22 ENCOUNTER — Other Ambulatory Visit (HOSPITAL_COMMUNITY): Payer: Self-pay | Admitting: Orthopedic Surgery

## 2014-12-24 ENCOUNTER — Encounter (HOSPITAL_COMMUNITY): Payer: Self-pay | Admitting: *Deleted

## 2014-12-24 MED ORDER — CEFAZOLIN SODIUM-DEXTROSE 2-3 GM-% IV SOLR
2.0000 g | INTRAVENOUS | Status: AC
  Administered 2014-12-25: 2 g via INTRAVENOUS
  Filled 2014-12-24: qty 50

## 2014-12-25 ENCOUNTER — Ambulatory Visit (HOSPITAL_COMMUNITY): Payer: Managed Care, Other (non HMO) | Admitting: Certified Registered Nurse Anesthetist

## 2014-12-25 ENCOUNTER — Encounter (HOSPITAL_COMMUNITY): Payer: Self-pay | Admitting: *Deleted

## 2014-12-25 ENCOUNTER — Ambulatory Visit (HOSPITAL_COMMUNITY)
Admission: RE | Admit: 2014-12-25 | Discharge: 2014-12-26 | Disposition: A | Discharge status: Home / Self Care | Payer: Managed Care, Other (non HMO) | Source: Ambulatory Visit | Attending: Orthopedic Surgery | Admitting: Orthopedic Surgery

## 2014-12-25 ENCOUNTER — Encounter (HOSPITAL_COMMUNITY)
Admission: RE | Disposition: A | Discharge status: Home / Self Care | Payer: Self-pay | Source: Ambulatory Visit | Attending: Orthopedic Surgery

## 2014-12-25 DIAGNOSIS — X58XXXA Exposure to other specified factors, initial encounter: Secondary | ICD-10-CM | POA: Insufficient documentation

## 2014-12-25 DIAGNOSIS — F17210 Nicotine dependence, cigarettes, uncomplicated: Secondary | ICD-10-CM | POA: Insufficient documentation

## 2014-12-25 DIAGNOSIS — K219 Gastro-esophageal reflux disease without esophagitis: Secondary | ICD-10-CM | POA: Diagnosis not present

## 2014-12-25 DIAGNOSIS — Y929 Unspecified place or not applicable: Secondary | ICD-10-CM | POA: Insufficient documentation

## 2014-12-25 DIAGNOSIS — S76112A Strain of left quadriceps muscle, fascia and tendon, initial encounter: Secondary | ICD-10-CM | POA: Diagnosis not present

## 2014-12-25 DIAGNOSIS — S76119A Strain of unspecified quadriceps muscle, fascia and tendon, initial encounter: Secondary | ICD-10-CM | POA: Diagnosis present

## 2014-12-25 HISTORY — DX: Pneumonia, unspecified organism: J18.9

## 2014-12-25 HISTORY — DX: Gastro-esophageal reflux disease without esophagitis: K21.9

## 2014-12-25 HISTORY — PX: QUADRICEPS TENDON REPAIR: SHX756

## 2014-12-25 LAB — COMPREHENSIVE METABOLIC PANEL
ALBUMIN: 3.6 g/dL (ref 3.5–5.2)
ALT: 15 U/L (ref 0–53)
AST: 26 U/L (ref 0–37)
Alkaline Phosphatase: 73 U/L (ref 39–117)
Anion gap: 5 (ref 5–15)
BILIRUBIN TOTAL: 0.9 mg/dL (ref 0.3–1.2)
BUN: 13 mg/dL (ref 6–23)
CHLORIDE: 105 mmol/L (ref 96–112)
CO2: 28 mmol/L (ref 19–32)
CREATININE: 1.01 mg/dL (ref 0.50–1.35)
Calcium: 9.2 mg/dL (ref 8.4–10.5)
GFR calc Af Amer: >90 mL/min (ref >90)
GFR calc non Af Amer: 78 mL/min — ABNORMAL LOW (ref >90)
Glucose, Bld: 105 mg/dL — ABNORMAL HIGH (ref 70–99)
Potassium: 4.1 mmol/L (ref 3.5–5.1)
Sodium: 138 mmol/L (ref 135–145)
Total Protein: 6.4 g/dL (ref 6.0–8.3)

## 2014-12-25 LAB — CBC
HCT: 43.3 % (ref 39.0–52.0)
Hemoglobin: 14.4 g/dL (ref 13.0–17.0)
MCH: 31.2 pg (ref 26.0–34.0)
MCHC: 33.3 g/dL (ref 30.0–36.0)
MCV: 93.7 fL (ref 78.0–100.0)
PLATELETS: 195 10*3/uL (ref 150–400)
RBC: 4.62 MIL/uL (ref 4.22–5.81)
RDW: 12.9 % (ref 11.5–15.5)
WBC: 7.1 10*3/uL (ref 4.0–10.5)

## 2014-12-25 LAB — APTT: APTT: 36 s (ref 24–37)

## 2014-12-25 LAB — PROTIME-INR
INR: 1.07 (ref 0.00–1.49)
PROTHROMBIN TIME: 14.1 s (ref 11.6–15.2)

## 2014-12-25 SURGERY — REPAIR, TENDON, QUADRICEPS
Anesthesia: Regional | Laterality: Left

## 2014-12-25 MED ORDER — HYDROMORPHONE HCL 1 MG/ML IJ SOLN
0.5000 mg | INTRAMUSCULAR | Status: AC | PRN
  Administered 2014-12-25 (×2): 0.5 mg via INTRAVENOUS

## 2014-12-25 MED ORDER — METHOCARBAMOL 500 MG PO TABS
ORAL_TABLET | ORAL | Status: AC
  Filled 2014-12-25: qty 1

## 2014-12-25 MED ORDER — SODIUM CHLORIDE 0.9 % IJ SOLN
INTRAMUSCULAR | Status: AC
  Filled 2014-12-25: qty 10

## 2014-12-25 MED ORDER — SODIUM CHLORIDE 0.9 % IV SOLN: INTRAVENOUS | Status: DC

## 2014-12-25 MED ORDER — PROPOFOL 10 MG/ML IV BOLUS
INTRAVENOUS | Status: AC
  Filled 2014-12-25: qty 20

## 2014-12-25 MED ORDER — ROCURONIUM BROMIDE 50 MG/5ML IV SOLN
INTRAVENOUS | Status: AC
  Filled 2014-12-25: qty 1

## 2014-12-25 MED ORDER — METOCLOPRAMIDE HCL 5 MG/ML IJ SOLN: 5.0000 mg | Freq: Three times a day (TID) | INTRAMUSCULAR | Status: DC | PRN

## 2014-12-25 MED ORDER — EPHEDRINE SULFATE 50 MG/ML IJ SOLN
INTRAMUSCULAR | Status: AC
  Filled 2014-12-25: qty 1

## 2014-12-25 MED ORDER — FENTANYL CITRATE 0.05 MG/ML IJ SOLN
INTRAMUSCULAR | Status: DC | PRN
  Administered 2014-12-25: 100 ug via INTRAVENOUS
  Administered 2014-12-25: 50 ug via INTRAVENOUS

## 2014-12-25 MED ORDER — ONDANSETRON HCL 4 MG/2ML IJ SOLN
4.0000 mg | Freq: Once | INTRAMUSCULAR | Status: DC | PRN
Start: 2014-12-25 — End: 2014-12-25

## 2014-12-25 MED ORDER — FENTANYL CITRATE 0.05 MG/ML IJ SOLN
100.0000 ug | Freq: Once | INTRAMUSCULAR | Status: AC
  Administered 2014-12-25: 100 ug via INTRAVENOUS

## 2014-12-25 MED ORDER — HYDROMORPHONE HCL 1 MG/ML IJ SOLN
INTRAMUSCULAR | Status: AC
  Filled 2014-12-25: qty 1

## 2014-12-25 MED ORDER — HYDROMORPHONE HCL 1 MG/ML IJ SOLN
0.5000 mg | INTRAMUSCULAR | Status: DC | PRN
  Administered 2014-12-25 – 2014-12-26 (×3): 1 mg via INTRAVENOUS
  Filled 2014-12-25 (×3): qty 1

## 2014-12-25 MED ORDER — OXYCODONE-ACETAMINOPHEN 5-325 MG PO TABS
ORAL_TABLET | ORAL | Status: AC
  Filled 2014-12-25: qty 2

## 2014-12-25 MED ORDER — CEFAZOLIN SODIUM 1-5 GM-% IV SOLN
1.0000 g | Freq: Four times a day (QID) | INTRAVENOUS | Status: AC
  Administered 2014-12-25 – 2014-12-26 (×3): 1 g via INTRAVENOUS
  Filled 2014-12-25 (×3): qty 50

## 2014-12-25 MED ORDER — MEPERIDINE HCL 25 MG/ML IJ SOLN: 6.2500 mg | INTRAMUSCULAR | Status: DC | PRN

## 2014-12-25 MED ORDER — ONDANSETRON HCL 4 MG/2ML IJ SOLN
INTRAMUSCULAR | Status: DC | PRN
  Administered 2014-12-25: 4 mg via INTRAVENOUS

## 2014-12-25 MED ORDER — PROPOFOL 10 MG/ML IV BOLUS
INTRAVENOUS | Status: DC | PRN
  Administered 2014-12-25: 150 mg via INTRAVENOUS
  Administered 2014-12-25: 50 mg via INTRAVENOUS

## 2014-12-25 MED ORDER — MIDAZOLAM HCL 2 MG/2ML IJ SOLN: 2.0000 mg | Freq: Once | INTRAMUSCULAR | Status: DC

## 2014-12-25 MED ORDER — OXYCODONE-ACETAMINOPHEN 5-325 MG PO TABS: 1.0000 | ORAL_TABLET | ORAL | Status: DC | PRN

## 2014-12-25 MED ORDER — BUPIVACAINE-EPINEPHRINE (PF) 0.5% -1:200000 IJ SOLN
INTRAMUSCULAR | Status: DC | PRN
  Administered 2014-12-25: 30 mL via PERINEURAL

## 2014-12-25 MED ORDER — LIDOCAINE HCL (CARDIAC) 20 MG/ML IV SOLN
INTRAVENOUS | Status: AC
  Filled 2014-12-25: qty 15

## 2014-12-25 MED ORDER — METHOCARBAMOL 500 MG PO TABS
500.0000 mg | ORAL_TABLET | Freq: Four times a day (QID) | ORAL | Status: DC | PRN
  Administered 2014-12-25 – 2014-12-26 (×3): 500 mg via ORAL
  Filled 2014-12-25 (×3): qty 1

## 2014-12-25 MED ORDER — ONDANSETRON HCL 4 MG PO TABS: 4.0000 mg | ORAL_TABLET | Freq: Four times a day (QID) | ORAL | Status: DC | PRN

## 2014-12-25 MED ORDER — GLYCOPYRROLATE 0.2 MG/ML IJ SOLN
INTRAMUSCULAR | Status: AC
  Filled 2014-12-25: qty 2

## 2014-12-25 MED ORDER — DOCUSATE SODIUM 100 MG PO CAPS
100.0000 mg | ORAL_CAPSULE | Freq: Two times a day (BID) | ORAL | Status: DC
  Administered 2014-12-25: 100 mg via ORAL
  Filled 2014-12-25: qty 1

## 2014-12-25 MED ORDER — MIDAZOLAM HCL 2 MG/2ML IJ SOLN
INTRAMUSCULAR | Status: AC
  Filled 2014-12-25: qty 2

## 2014-12-25 MED ORDER — METOCLOPRAMIDE HCL 10 MG PO TABS: 5.0000 mg | ORAL_TABLET | Freq: Three times a day (TID) | ORAL | Status: DC | PRN

## 2014-12-25 MED ORDER — ARTIFICIAL TEARS OP OINT
TOPICAL_OINTMENT | OPHTHALMIC | Status: AC
  Filled 2014-12-25: qty 7

## 2014-12-25 MED ORDER — ALBUTEROL SULFATE HFA 108 (90 BASE) MCG/ACT IN AERS
INHALATION_SPRAY | RESPIRATORY_TRACT | Status: DC | PRN
  Administered 2014-12-25: 3 via RESPIRATORY_TRACT

## 2014-12-25 MED ORDER — LACTATED RINGERS IV SOLN
INTRAVENOUS | Status: DC
  Administered 2014-12-25: 12:00:00 via INTRAVENOUS

## 2014-12-25 MED ORDER — ONDANSETRON HCL 4 MG/2ML IJ SOLN: 4.0000 mg | Freq: Four times a day (QID) | INTRAMUSCULAR | Status: DC | PRN

## 2014-12-25 MED ORDER — FENTANYL CITRATE 0.05 MG/ML IJ SOLN
INTRAMUSCULAR | Status: AC
  Filled 2014-12-25: qty 2

## 2014-12-25 MED ORDER — MIDAZOLAM HCL 2 MG/2ML IJ SOLN
INTRAMUSCULAR | Status: AC
  Administered 2014-12-25: 2 mg
  Filled 2014-12-25: qty 2

## 2014-12-25 MED ORDER — OXYCODONE-ACETAMINOPHEN 5-325 MG PO TABS
1.0000 | ORAL_TABLET | ORAL | Status: DC | PRN
  Administered 2014-12-25 – 2014-12-26 (×3): 2 via ORAL
  Filled 2014-12-25 (×2): qty 2

## 2014-12-25 MED ORDER — LIDOCAINE HCL (CARDIAC) 20 MG/ML IV SOLN
INTRAVENOUS | Status: DC | PRN
  Administered 2014-12-25: 100 mg via INTRAVENOUS

## 2014-12-25 MED ORDER — FENTANYL CITRATE 0.05 MG/ML IJ SOLN
INTRAMUSCULAR | Status: AC
  Filled 2014-12-25: qty 5

## 2014-12-25 MED ORDER — PHENYLEPHRINE 40 MCG/ML (10ML) SYRINGE FOR IV PUSH (FOR BLOOD PRESSURE SUPPORT)
PREFILLED_SYRINGE | INTRAVENOUS | Status: AC
  Filled 2014-12-25: qty 40

## 2014-12-25 MED ORDER — ASPIRIN EC 325 MG PO TBEC
325.0000 mg | DELAYED_RELEASE_TABLET | Freq: Every day | ORAL | Status: DC
  Administered 2014-12-25: 325 mg via ORAL

## 2014-12-25 MED ORDER — METHOCARBAMOL 1000 MG/10ML IJ SOLN
500.0000 mg | Freq: Four times a day (QID) | INTRAVENOUS | Status: DC | PRN
  Filled 2014-12-25: qty 5

## 2014-12-25 MED ORDER — 0.9 % SODIUM CHLORIDE (POUR BTL) OPTIME
TOPICAL | Status: DC | PRN
  Administered 2014-12-25: 1000 mL

## 2014-12-25 MED ORDER — HYDROMORPHONE HCL 1 MG/ML IJ SOLN
0.2500 mg | INTRAMUSCULAR | Status: DC | PRN
  Administered 2014-12-25 (×2): 1 mg via INTRAVENOUS

## 2014-12-25 SURGICAL SUPPLY — 61 items
BANDAGE ELASTIC 4 VELCRO ST LF (GAUZE/BANDAGES/DRESSINGS) IMPLANT
BANDAGE ELASTIC 6 VELCRO ST LF (GAUZE/BANDAGES/DRESSINGS) IMPLANT
BLADE SURG 10 STRL SS (BLADE) IMPLANT
BNDG COHESIVE 4X5 TAN STRL (GAUZE/BANDAGES/DRESSINGS) ×3 IMPLANT
BNDG COHESIVE 6X5 TAN STRL LF (GAUZE/BANDAGES/DRESSINGS) ×3 IMPLANT
BNDG GAUZE ELAST 4 BULKY (GAUZE/BANDAGES/DRESSINGS) ×3 IMPLANT
CLOSURE STERI-STRIP 1/2X4 (GAUZE/BANDAGES/DRESSINGS) ×1
CLSR STERI-STRIP ANTIMIC 1/2X4 (GAUZE/BANDAGES/DRESSINGS) ×2 IMPLANT
COVER MAYO STAND STRL (DRAPES) IMPLANT
CUFF TOURNIQUET SINGLE 24IN (TOURNIQUET CUFF) IMPLANT
CUFF TOURNIQUET SINGLE 34IN LL (TOURNIQUET CUFF) IMPLANT
CUFF TOURNIQUET SINGLE 44IN (TOURNIQUET CUFF) IMPLANT
DRAPE INCISE IOBAN 66X45 STRL (DRAPES) IMPLANT
DRAPE U-SHAPE 47X51 STRL (DRAPES) ×3 IMPLANT
DRSG ADAPTIC 3X8 NADH LF (GAUZE/BANDAGES/DRESSINGS) ×3 IMPLANT
DRSG PAD ABDOMINAL 8X10 ST (GAUZE/BANDAGES/DRESSINGS) ×3 IMPLANT
DURAPREP 26ML APPLICATOR (WOUND CARE) ×3 IMPLANT
ELECT REM PT RETURN 9FT ADLT (ELECTROSURGICAL) ×3
ELECTRODE REM PT RTRN 9FT ADLT (ELECTROSURGICAL) ×1 IMPLANT
GAUZE SPONGE 4X4 12PLY STRL (GAUZE/BANDAGES/DRESSINGS) ×3 IMPLANT
GLOVE BIOGEL PI IND STRL 6 (GLOVE) ×1 IMPLANT
GLOVE BIOGEL PI IND STRL 7.0 (GLOVE) ×1 IMPLANT
GLOVE BIOGEL PI IND STRL 9 (GLOVE) ×1 IMPLANT
GLOVE BIOGEL PI INDICATOR 6 (GLOVE) ×2
GLOVE BIOGEL PI INDICATOR 7.0 (GLOVE) ×2
GLOVE BIOGEL PI INDICATOR 9 (GLOVE) ×2
GLOVE ECLIPSE 6.0 STRL STRAW (GLOVE) ×3 IMPLANT
GLOVE ECLIPSE 7.0 STRL STRAW (GLOVE) ×3 IMPLANT
GLOVE SURG ORTHO 9.0 STRL STRW (GLOVE) ×3 IMPLANT
GOWN STRL REUS W/ TWL XL LVL3 (GOWN DISPOSABLE) ×3 IMPLANT
GOWN STRL REUS W/TWL XL LVL3 (GOWN DISPOSABLE) ×9
IMMOBILIZER KNEE 22 (SOFTGOODS) ×3 IMPLANT
KIT BASIN OR (CUSTOM PROCEDURE TRAY) ×3 IMPLANT
KIT ROOM TURNOVER OR (KITS) ×3 IMPLANT
MANIFOLD NEPTUNE II (INSTRUMENTS) IMPLANT
NEEDLE MAYO 1/2 CIRCLE (NEEDLE) ×3 IMPLANT
NEEDLE STRAIGHT KEITH (NEEDLE) IMPLANT
NS IRRIG 1000ML POUR BTL (IV SOLUTION) ×3 IMPLANT
PACK ORTHO EXTREMITY (CUSTOM PROCEDURE TRAY) ×3 IMPLANT
PAD ARMBOARD 7.5X6 YLW CONV (MISCELLANEOUS) ×6 IMPLANT
PAD CAST 4YDX4 CTTN HI CHSV (CAST SUPPLIES) ×1 IMPLANT
PADDING CAST COTTON 4X4 STRL (CAST SUPPLIES) ×3
PADDING CAST COTTON 6X4 STRL (CAST SUPPLIES) IMPLANT
PASSER SUT SWANSON 36MM LOOP (INSTRUMENTS) ×3 IMPLANT
PENCIL BUTTON HOLSTER BLD 10FT (ELECTRODE) ×3 IMPLANT
SPONGE LAP 18X18 X RAY DECT (DISPOSABLE) ×6 IMPLANT
STAPLER VISISTAT 35W (STAPLE) IMPLANT
STOCKINETTE IMPERVIOUS 9X36 MD (GAUZE/BANDAGES/DRESSINGS) ×3 IMPLANT
SUT ETHIBOND 2 OS 4 DA (SUTURE) IMPLANT
SUT ETHIBOND NAB BRD #0 18IN (SUTURE) IMPLANT
SUT FIBERWIRE #2 38 T-5 BLUE (SUTURE) ×9
SUT MNCRL AB 3-0 PS2 18 (SUTURE) ×3 IMPLANT
SUT VIC AB 0 CTB1 27 (SUTURE) IMPLANT
SUT VIC AB 2-0 CTB1 (SUTURE) ×3 IMPLANT
SUTURE FIBERWR #2 38 T-5 BLUE (SUTURE) ×3 IMPLANT
TOWEL OR 17X24 6PK STRL BLUE (TOWEL DISPOSABLE) ×3 IMPLANT
TOWEL OR 17X26 10 PK STRL BLUE (TOWEL DISPOSABLE) IMPLANT
TUBE CONNECTING 12'X1/4 (SUCTIONS) ×1
TUBE CONNECTING 12X1/4 (SUCTIONS) ×2 IMPLANT
WATER STERILE IRR 1000ML POUR (IV SOLUTION) IMPLANT
YANKAUER SUCT BULB TIP NO VENT (SUCTIONS) ×3 IMPLANT

## 2014-12-25 NOTE — Transfer of Care (Signed)
Immediate Anesthesia Transfer of Care Note  Patient: Charles Velazquez  Procedure(s) Performed: Procedure(s): Left Quadriceps Tendon Reconstruction (Left)  Patient Location: PACU  Anesthesia Type:General and Regional  Level of Consciousness: awake, alert , oriented and patient cooperative  Airway & Oxygen Therapy: Patient Spontanous Breathing and Patient connected to nasal cannula oxygen  Post-op Assessment: Report given to RN, Post -op Vital signs reviewed and stable, Patient moving all extremities and Patient moving all extremities X 4  Post vital signs: Reviewed and stable  Last Vitals:  Filed Vitals:   12/25/14 1322  BP:   Pulse: 79  Temp:   Resp: 18    Complications: No apparent anesthesia complications

## 2014-12-25 NOTE — Anesthesia Preprocedure Evaluation (Signed)
Anesthesia Evaluation  Patient identified by MRN, date of birth, ID band Patient awake    Reviewed: Allergy & Precautions, NPO status , Patient's Chart, lab work & pertinent test results  Airway Mallampati: I  TM Distance: >3 FB Neck ROM: Full    Dental   Pulmonary COPDCurrent Smoker,          Cardiovascular     Neuro/Psych    GI/Hepatic GERD-  Medicated and Controlled,  Endo/Other    Renal/GU      Musculoskeletal   Abdominal   Peds  Hematology   Anesthesia Other Findings   Reproductive/Obstetrics                             Anesthesia Physical Anesthesia Plan  ASA: II  Anesthesia Plan: General   Post-op Pain Management:    Induction: Intravenous  Airway Management Planned: LMA  Additional Equipment:   Intra-op Plan:   Post-operative Plan: Extubation in OR  Informed Consent: I have reviewed the patients History and Physical, chart, labs and discussed the procedure including the risks, benefits and alternatives for the proposed anesthesia with the patient or authorized representative who has indicated his/her understanding and acceptance.     Plan Discussed with: CRNA and Surgeon  Anesthesia Plan Comments:         Anesthesia Quick Evaluation

## 2014-12-25 NOTE — Op Note (Signed)
12/25/2014  5:09 PM  PATIENT:  Charles Velazquez    PRE-OPERATIVE DIAGNOSIS:  Left Quadriceps Tendon Rupture  POST-OPERATIVE DIAGNOSIS:  Same  PROCEDURE:  Left Quadriceps Tendon Reconstruction  SURGEON:  Newt Minion, MD  PHYSICIAN ASSISTANT:None ANESTHESIA:   General  PREOPERATIVE INDICATIONS:  Charles Velazquez is a  64 y.o. male with a diagnosis of Left Quadriceps Tendon Rupture who failed conservative measures and elected for surgical management.    The risks benefits and alternatives were discussed with the patient preoperatively including but not limited to the risks of infection, bleeding, nerve injury, cardiopulmonary complications, the need for revision surgery, among others, and the patient was willing to proceed.  OPERATIVE IMPLANTS: None  OPERATIVE FINDINGS: Complete rupture of the quad tendon  OPERATIVE PROCEDURE: Patient was brought to the operating room and underwent a general anesthetic. After adequate levels of anesthesia were obtained patient's left lower extremity was prepped using DuraPrep draped into a sterile field. A timeout was called. A midline incision was made over the patella. Examination patient had complete rupture of the quad tendon. The wound was irrigated with normal saline. Using Krakw suture technique 2 #2 FiberWire sutures were woven through the proximal stump using Krakw technique. 2 drill holes were then placed in the patella. Using a suture passer the sutures were passed through the drill holes with the knee extension the sutures were tied on the inferior aspect of the patella pole. A running suture was then used to further repair the rupture with repair of both the vastus lateralis and vastus medialis. Patient had good tension on the repair. The wound was irrigated with normal saline. Subcutaneous was closed using 2-0 Vicryl and the skin was closed using staples. A sterile compressive dressing was applied. Patient was placed in the immobilizer  extubated taken to the PACU in stable condition.

## 2014-12-25 NOTE — H&P (Signed)
Charles Velazquez is an 64 y.o. male.   Chief Complaint: Quad tendon rupture left knee HPI: Patient is a 64 year old gentleman who sustained an acute quad tendon rupture of his left knee when he fell  Past Medical History  Diagnosis Date  . Pneumonia   . GERD (gastroesophageal reflux disease)     occasional - uses OTC    Past Surgical History  Procedure Laterality Date  . Rotator cuff repair      left   . Shoulder surgery  2012  . Colonoscopy    . Vasectomy      Family History  Problem Relation Age of Onset  . Renal cancer Brother   . Colon cancer Neg Hx   . Dementia Mother    Social History:  reports that he has been smoking Cigarettes.  He started smoking about 47 years ago. He has a 45 pack-year smoking history. He has never used smokeless tobacco. He reports that he drinks about 16.8 oz of alcohol per week. He reports that he uses illicit drugs (Marijuana).  Allergies: No Known Allergies  No prescriptions prior to admission    No results found for this or any previous visit (from the past 48 hour(s)). No results found.  Review of Systems  All other systems reviewed and are negative.   There were no vitals taken for this visit. Physical Exam  On examination patient has a palpable defect of the quad tendon. Patient cannot do a straight leg raise. Assessment/Plan Assessment: Complete quad tendon rupture left knee.  Plan: We will plan for repair of the left quad tendon rupture. Risks and benefits were discussed patient states he understands and wishes to proceed at this time.  DUDA,MARCUS V 12/25/2014, 6:05 AM

## 2014-12-25 NOTE — Anesthesia Procedure Notes (Addendum)
Anesthesia Regional Block:  Adductor canal block  Pre-Anesthetic Checklist: ,, timeout performed, Correct Patient, Correct Site, Correct Laterality, Correct Procedure, Correct Position, site marked, Risks and benefits discussed,  Surgical consent,  Pre-op evaluation,  At surgeon's request and post-op pain management  Laterality: Left  Prep: chloraprep       Needles:  Injection technique: Single-shot  Needle Type: Echogenic Stimulator Needle     Needle Length: 9cm 9 cm Needle Gauge: 21 and 21 G    Additional Needles:  Procedures: ultrasound guided (picture in chart) Adductor canal block Narrative:  Start time: 12/25/2014 1:15 PM End time: 12/25/2014 1:25 PM Injection made incrementally with aspirations every 5 mL.  Performed by: Personally  Anesthesiologist: Lillia Abed  Additional Notes: Monitors applied. Patient sedated. Sterile prep and drape,hand hygiene and sterile gloves were used. Relevant anatomy identified.Needle position confirmed.Local anesthetic injected incrementally after negative aspiration. Local anesthetic spread visualized around nerve(s). Vascular puncture avoided. No complications. Image printed for medical record.The patient tolerated the procedure well.    Lillia Abed MD   Procedure Name: LMA Insertion Date/Time: 12/25/2014 1:39 PM Performed by: Shirlyn Goltz Pre-anesthesia Checklist: Patient identified, Emergency Drugs available, Suction available and Patient being monitored Patient Re-evaluated:Patient Re-evaluated prior to inductionOxygen Delivery Method: Circle system utilized Preoxygenation: Pre-oxygenation with 100% oxygen Intubation Type: IV induction LMA: LMA inserted LMA Size: 5.0 Number of attempts: 1 Placement Confirmation: positive ETCO2 and breath sounds checked- equal and bilateral Tube secured with: Tape Dental Injury: Teeth and Oropharynx as per pre-operative assessment

## 2014-12-25 NOTE — Progress Notes (Signed)
Plan for discharge to home after overnight observation on Saturday morning. Prescription for Percocet on the chart.

## 2014-12-25 NOTE — Anesthesia Postprocedure Evaluation (Signed)
Anesthesia Post Note  Patient: Charles Velazquez  Procedure(s) Performed: Procedure(s) (LRB): Left Quadriceps Tendon Reconstruction (Left)  Anesthesia type: general  Patient location: PACU  Post pain: Pain level controlled  Post assessment: Patient's Cardiovascular Status Stable  Last Vitals:  Filed Vitals:   12/25/14 1645  BP: 152/80  Pulse: 76  Temp: 36.7 C  Resp: 15    Post vital signs: Reviewed and stable  Level of consciousness: sedated  Complications: No apparent anesthesia complications

## 2014-12-26 ENCOUNTER — Encounter (HOSPITAL_COMMUNITY): Payer: Self-pay | Admitting: Orthopedic Surgery

## 2014-12-26 DIAGNOSIS — S76112A Strain of left quadriceps muscle, fascia and tendon, initial encounter: Secondary | ICD-10-CM | POA: Diagnosis not present

## 2014-12-26 NOTE — Progress Notes (Signed)
PT Cancellation Note  Patient Details Name: Charles Velazquez MRN: [id number] DOB: 09-Apr-1951   Cancelled Treatment:    Reason Eval/Treat Not Completed: PT screened, no needs identified, will sign off. Per. Dr. Erlinda Hong pt does not need to see pt prior to d/c as pt getting around indep with crutches in room. PT signing off on pt.   Kingsley Callander 12/26/2014, 8:38 AM   Kittie Plater, PT, DPT Pager #: [phone] Office #: [phone]

## 2014-12-26 NOTE — Progress Notes (Signed)
Pt stable Dressing c/d/i NVI Home today  N. Eduard Roux, MD [place] 8:32 AM

## 2016-01-16 IMAGING — DX DG KNEE 1-2V*L*
2 series · 2 of 2 positions shown · non-contrast
Comparison: None.

CLINICAL DATA: Status post fall down 6 steps today. Left knee pain.
Initial encounter.

EXAM:
LEFT KNEE - 1-2 VIEW

[knee ap]
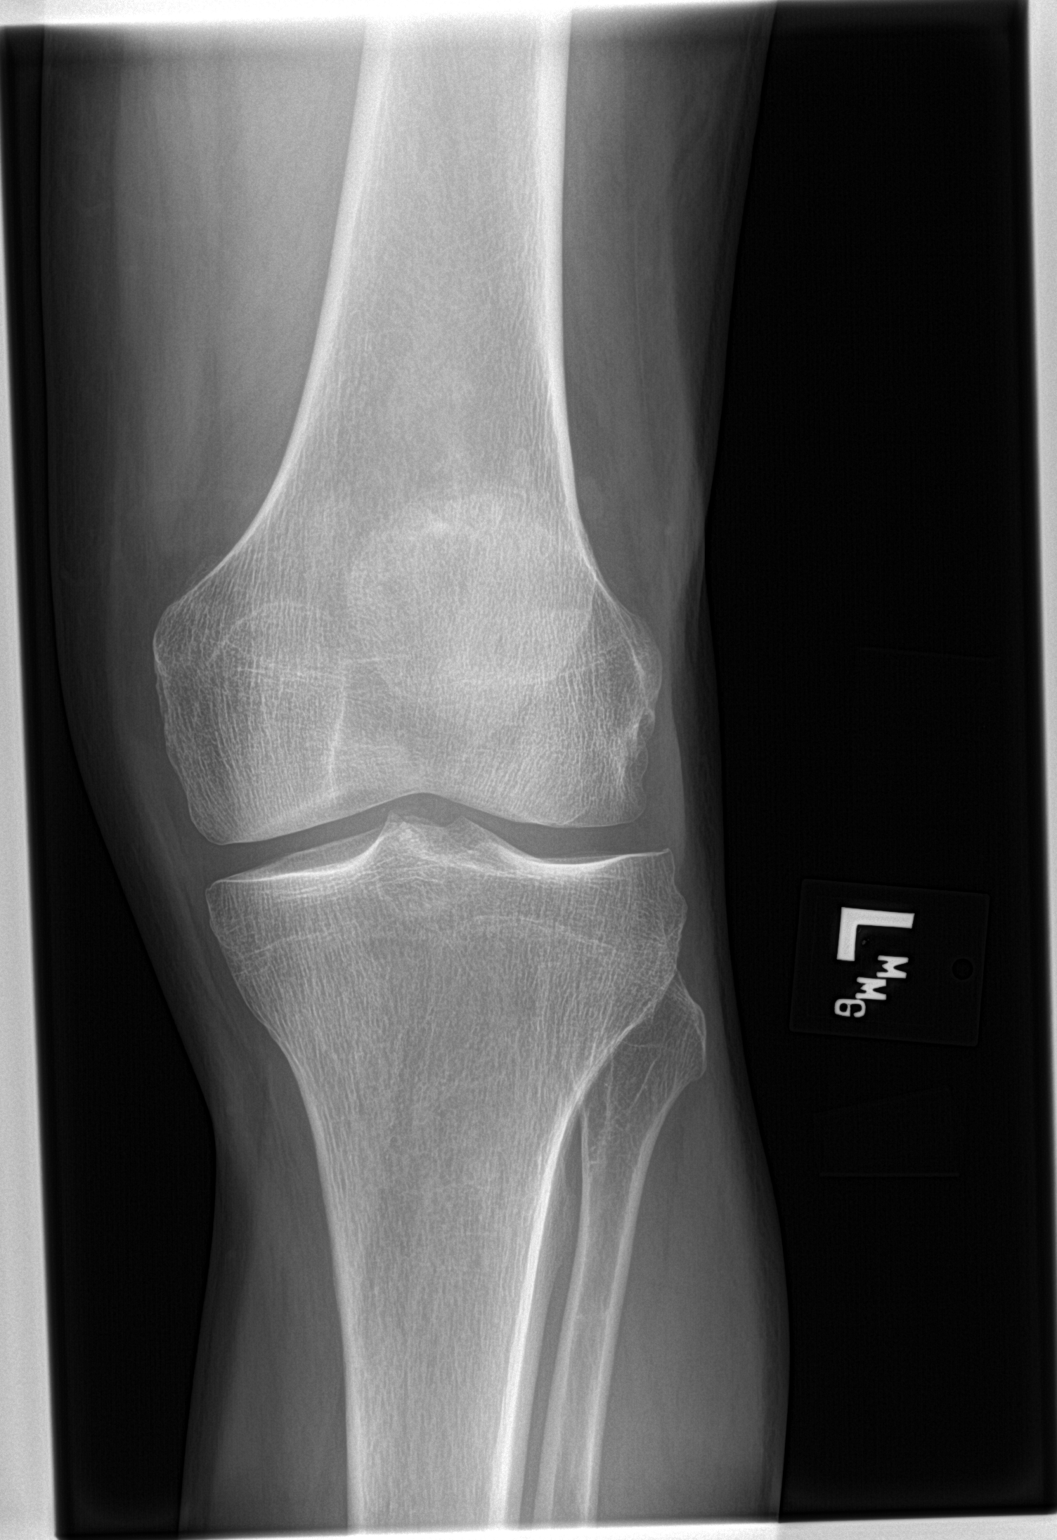

[knee lat]
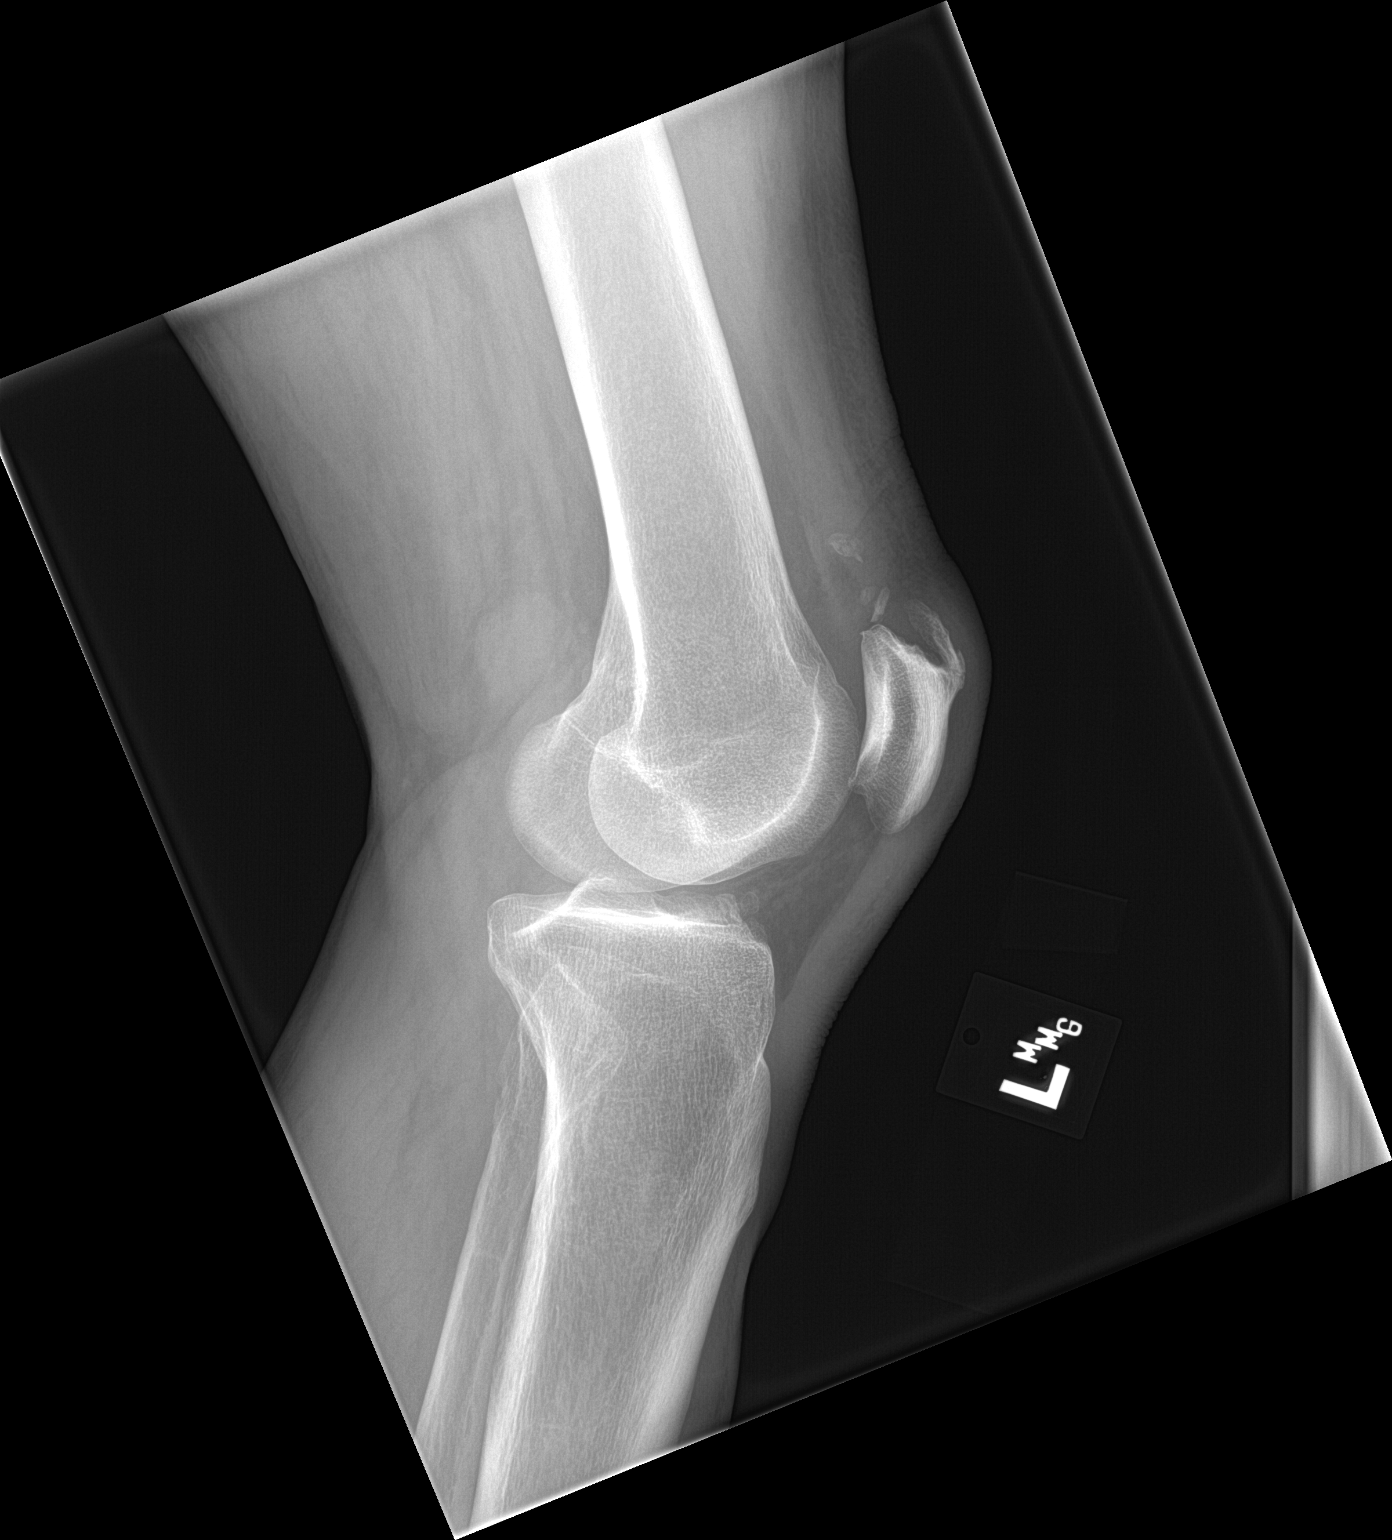

[2 of 2 positions shown; findings below may reference images not displayed]

FINDINGS: No acute bony or joint abnormality is identified. Spurring off the
superior pole of the patella is noted. Small calcifications
projecting in the quadriceps tendon are compatible with prior
inflammatory change or less likely injury. There is no joint
effusion. Joint spaces are preserved.
IMPRESSION: No acute abnormality.

## 2016-01-16 IMAGING — DX DG FEMUR 2+V*L*
4 series · 4 of 4 positions shown · non-contrast
Comparison: None

CLINICAL DATA: Fell down 6 steps today, LEFT knee injury and pain,
leg gave out when attempting to walk afterwards, knee buckled

EXAM:
LEFT FEMUR 2 VIEWS

[femur ap (1 of 2)]
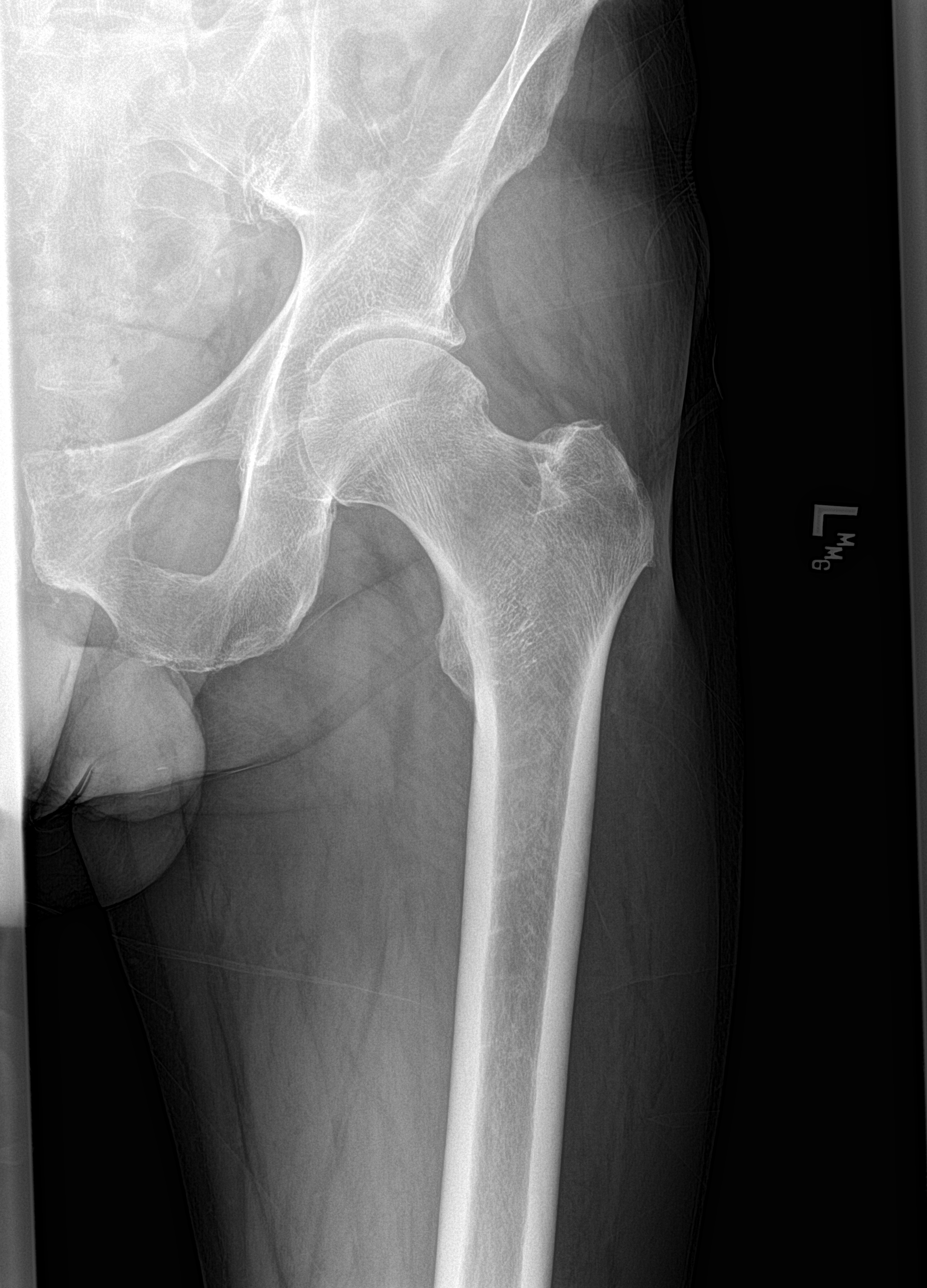

[femur ap (2 of 2)]
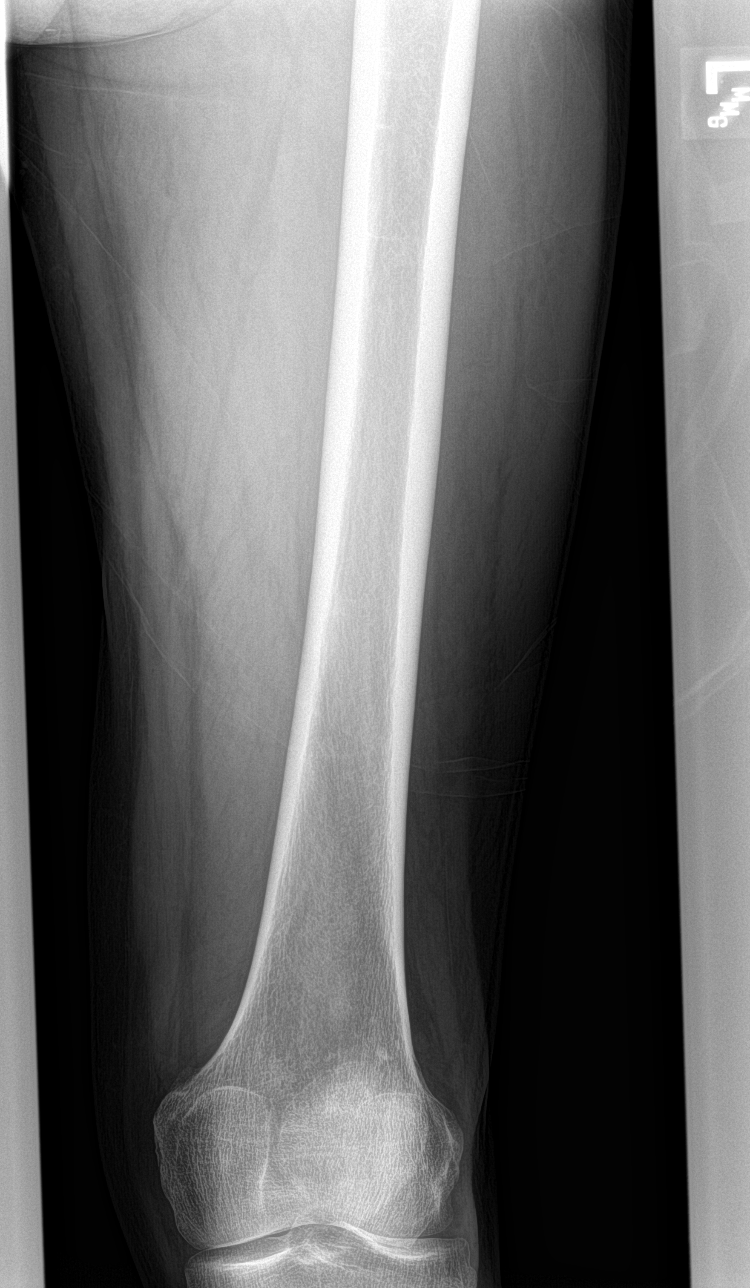

[femur lat (1 of 2)]
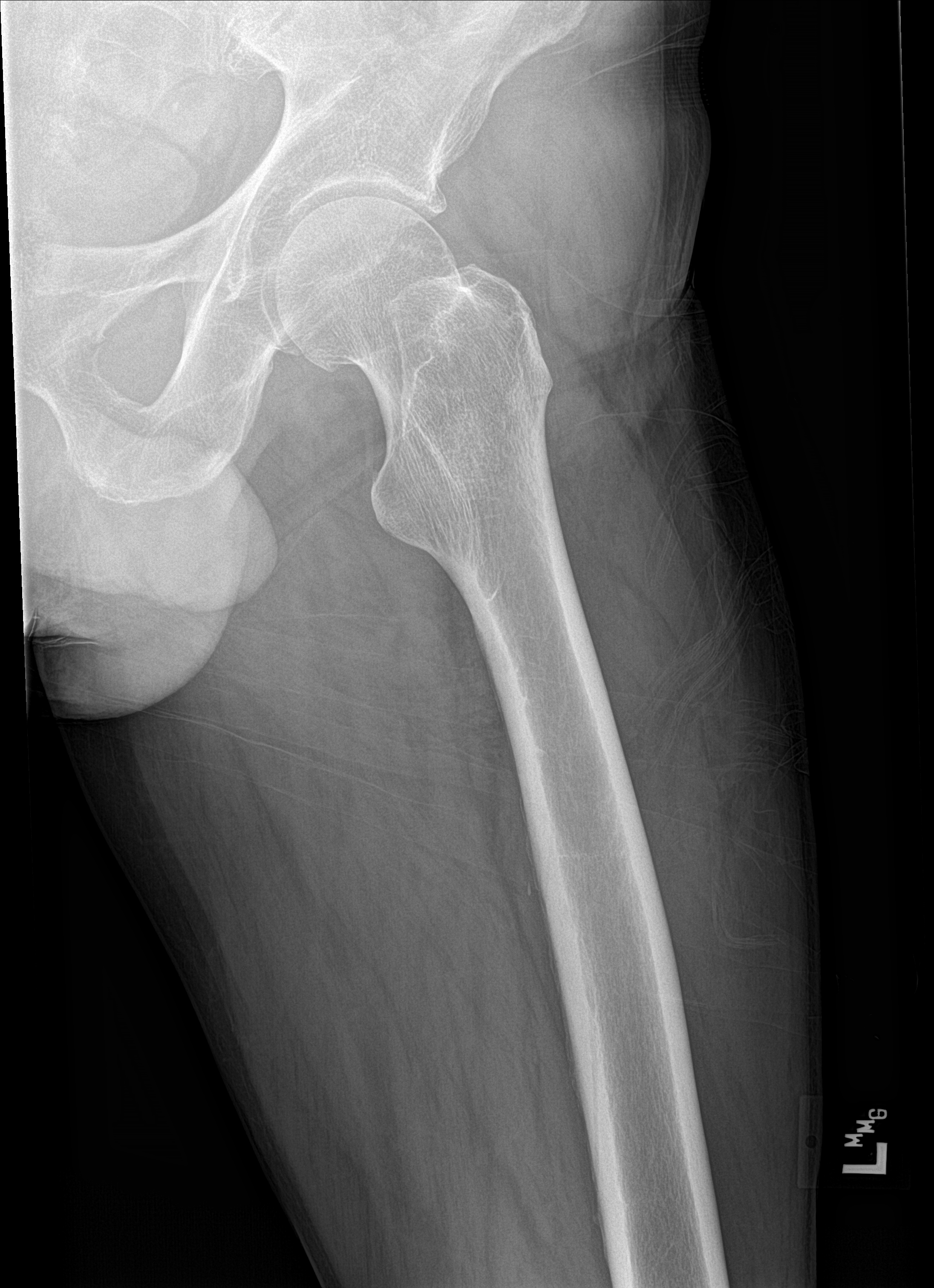

[femur lat (2 of 2)]
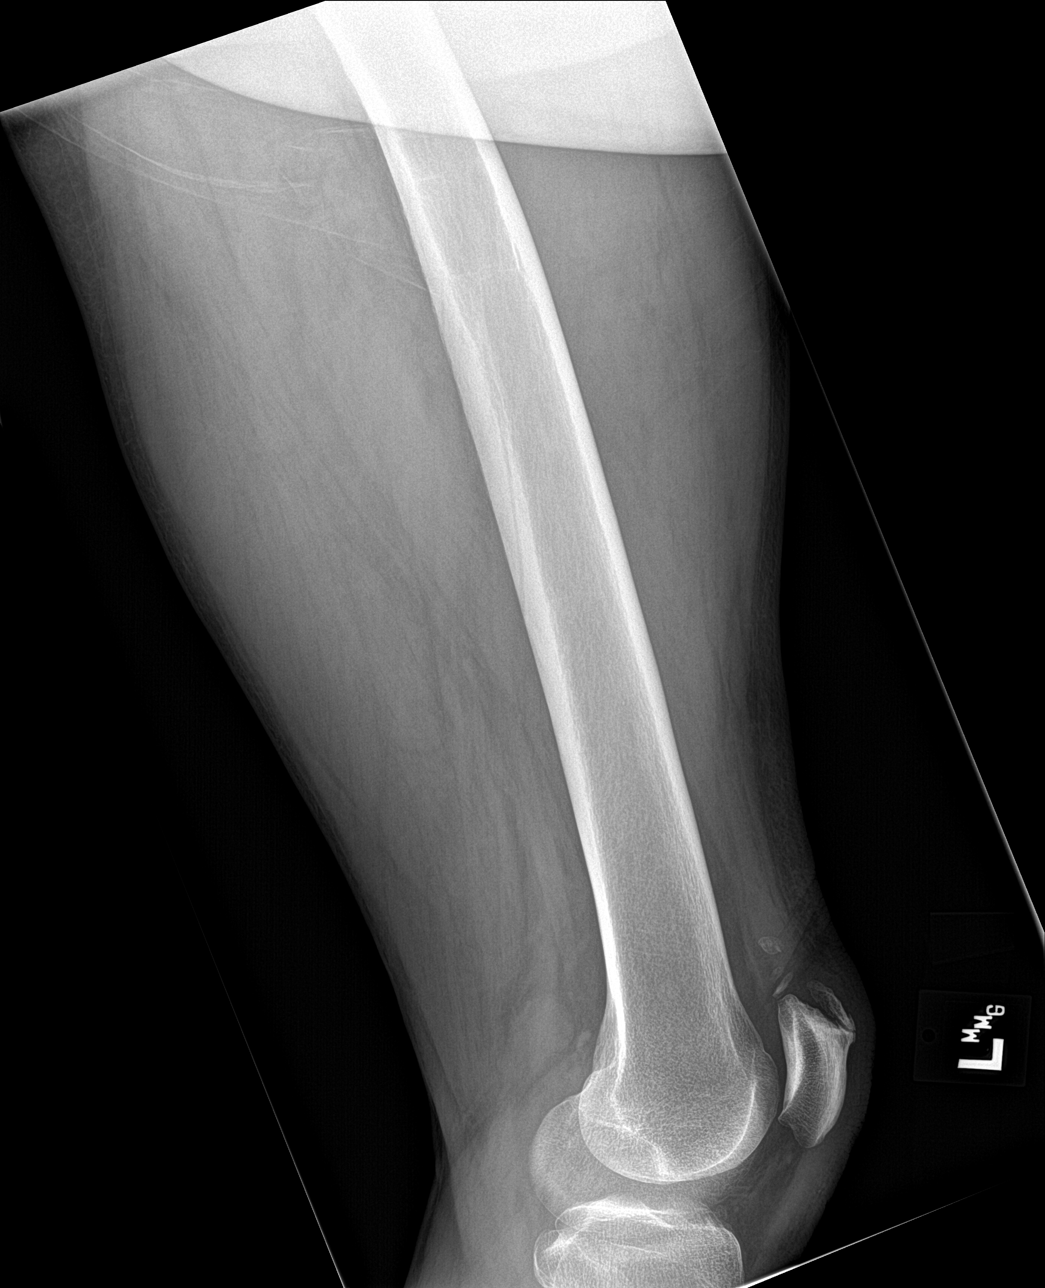

[4 of 4 positions shown; findings below may reference images not displayed]

FINDINGS: LEFT hip joint space preserved.

Mild diffuse joint space narrowing of LEFT knee joint.

Calcifications at distal quadriceps tendon and at quadriceps tendon
insertion at patella.

Quadriceps tendon appears thickened cranial to the patella, could
represent acute or chronic quadriceps tendon injury.

No acute fracture, dislocation or bone destruction.

No definite knee joint effusion.
IMPRESSION: Degenerative changes LEFT knee with calcific tendinitis at
quadriceps tendon.

No definite acute fracture or dislocation.

Unable to exclude acute on chronic quadriceps tendon injury; if this
is a clinical concern, recommend MR followup.

## 2016-08-25 DIAGNOSIS — H9313 Tinnitus, bilateral: Secondary | ICD-10-CM | POA: Insufficient documentation

## 2016-11-15 ENCOUNTER — Encounter: Payer: Self-pay | Admitting: Internal Medicine

## 2017-09-19 ENCOUNTER — Telehealth: Payer: Self-pay

## 2017-09-19 NOTE — Telephone Encounter (Signed)
Pt has not been seen since 2014 but was on our [facility] report. Per Dr Renold Genta made a 12pm appt for him to have CPE

## 2017-10-01 ENCOUNTER — Other Ambulatory Visit: Payer: PPO | Admitting: Internal Medicine

## 2017-10-01 NOTE — Addendum Note (Signed)
Addended by: Mady Haagensen on: 10/01/2017 09:08 AM   Modules accepted: Orders

## 2017-10-02 ENCOUNTER — Ambulatory Visit (INDEPENDENT_AMBULATORY_CARE_PROVIDER_SITE_OTHER): Payer: PPO | Admitting: Internal Medicine

## 2017-10-02 ENCOUNTER — Other Ambulatory Visit (INDEPENDENT_AMBULATORY_CARE_PROVIDER_SITE_OTHER): Payer: PPO | Admitting: Internal Medicine

## 2017-10-02 ENCOUNTER — Encounter: Payer: Self-pay | Admitting: Internal Medicine

## 2017-10-02 VITALS — BP 130/70 | HR 74 | Temp 98.0°F | Ht 68.25 in | Wt 171.0 lb

## 2017-10-02 DIAGNOSIS — Z0000 Encounter for general adult medical examination without abnormal findings: Secondary | ICD-10-CM

## 2017-10-02 DIAGNOSIS — Z23 Encounter for immunization: Secondary | ICD-10-CM

## 2017-10-02 DIAGNOSIS — Z86010 Personal history of colonic polyps: Secondary | ICD-10-CM | POA: Diagnosis not present

## 2017-10-02 DIAGNOSIS — Z13220 Encounter for screening for lipoid disorders: Secondary | ICD-10-CM

## 2017-10-02 DIAGNOSIS — Z125 Encounter for screening for malignant neoplasm of prostate: Secondary | ICD-10-CM | POA: Diagnosis not present

## 2017-10-02 MED ORDER — MELOXICAM 15 MG PO TABS: 15.0000 mg | ORAL_TABLET | Freq: Every day | ORAL | 2 refills | Status: DC

## 2017-10-02 NOTE — Progress Notes (Signed)
Subjective:    Patient ID: Charles Velazquez, male    DOB: 11/09/1950, 66 y.o.   MRN: [id number]  HPI 66 year old Male for health maintenance exam and evaluation of medical issues.  Initially presented here for physical examination in 2014.  Since that time he was in the emergency department February 2016 with left quadriceps tendon rupture and subsequently had surgery by Dr. Sharol Given for that.  Had an episode of acute maxillary sinusitis November 2017 was treated at [facility] urgent care.  Says he recently stopped smoking and feels much better.  Lab work reviewed and is essentially normal with the exception of a LDL cholesterol mildly elevated at 117.  Wart removed from behind left ear by [facility] in 2017.  No known drug allergies  No chronic medications  Had tetanus immunization 2012  History of colonoscopy by Dr. Henrene Pastor with hyperplastic and adenomatous polyps December 2013  Past medical history: Numerous fractures including left wrist, right forearm, right thumb x2, several fractured ribs, history of fractured ankle twice, concussion at age 36.  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  He is a native of [place] but raised in [place] and attended high school in [place] before moving to [place].  [place]oved to [place] 1978 with wife and 54-year-old daughter.  Long-standing history of heavy smoking pack per day for well over 40 years.  He drinks 5 or 6 beers daily.  Married.  Wife employed by [employer].  Family history: His sister perhaps with breast cancer patient is not exactly sure.  Mother with dementia.  Father with history of vertebroplasty.  They are both still living.  One brother age 75 with history of gout.  Another sister in good health and 3 adult daughters in good health.       Review of Systems  Constitutional: Negative.   Respiratory: Negative.   Cardiovascular: Negative.   Gastrointestinal: Negative.     Genitourinary: Negative.   Psychiatric/Behavioral: Negative.   All other systems reviewed and are negative.      Objective:   Physical Exam  Constitutional: He is oriented to person, place, and time. He appears well-developed and well-nourished. No distress.  HENT:  Head: Normocephalic and atraumatic.  Right Ear: External ear normal.  Left Ear: External ear normal.  Mouth/Throat: Oropharynx is clear and moist.  Eyes: Right eye exhibits no discharge. Left eye exhibits no discharge. No scleral icterus.  Neck: Neck supple. No JVD present. No thyromegaly present.  Cardiovascular: Normal rate, regular rhythm, normal heart sounds and intact distal pulses.  No murmur heard. Pulmonary/Chest: Effort normal and breath sounds normal. He has no wheezes. He has no rales.  Abdominal: Soft. Bowel sounds are normal. He exhibits no distension and no mass. There is no tenderness. There is no rebound and no guarding.  Genitourinary: Prostate normal.  Musculoskeletal: He exhibits no edema.  Lymphadenopathy:    He has no cervical adenopathy.  Neurological: He is alert and oriented to person, place, and time. He has normal reflexes. No cranial nerve deficit. Coordination normal.  Skin: Skin is warm and dry. No rash noted.  Multiple tattoos  Psychiatric: He has a normal mood and affect. His behavior is normal. Judgment and thought content normal.  Nursing note and vitals reviewed.         Assessment & Plan:  History of heavy smoking and likely has element of COPD but is not symptomatic.  Quit smoking recently and feels better.  History of adenomatous colon polyp  Plan: Return in 1 year or as needed.  Subjective:   Patient presents for Medicare Annual/Subsequent preventive examination.  Review Past Medical/Family/Social:   Risk Factors  Current exercise habits: A lot of exercise on the job and plays golf Dietary issues discussed: Low-fat low-carb.  He is not overweight.  See  above  Cardiac risk factors: History of smoking  Depression Screen  (Note: if answer to either of the following is "Yes", a more complete depression screening is indicated)   Over the past two weeks, have you felt down, depressed or hopeless? No  Over the past two weeks, have you felt little interest or pleasure in doing things? No Have you lost interest or pleasure in daily life? No Do you often feel hopeless? No Do you cry easily over simple problems? No   Activities of Daily Living  In your present state of health, do you have any difficulty performing the following activities?:   Driving? No  Managing money? No  Feeding yourself? No  Getting from bed to chair? No  Climbing a flight of stairs? No  Preparing food and eating?: No  Bathing or showering? No  Getting dressed: No  Getting to the toilet? No  Using the toilet:No  Moving around from place to place: No  In the past year have you fallen or had a near fall?:No  Are you sexually active? No  Do you have more than one partner? No   Hearing Difficulties: yes Do you often ask people to speak up or repeat themselves?  Yes Do you experience ringing or noises in your ears?  Yes Do you have difficulty understanding soft or whispered voices?  Yes Do you feel that you have a problem with memory?  Yes sometimes Do you often misplace items? No    Home Safety:  Do you have a smoke alarm at your residence?  No Do you have grab bars in the bathroom?  no Do you have throw rugs in your house?  Yes   Cognitive Testing  Alert? Yes Normal Appearance?Yes  Oriented to person? Yes Place? Yes  Time? Yes  Recall of three objects? Yes  Can perform simple calculations? Yes  Displays appropriate judgment?Yes  Can read the correct time from a watch face?Yes   List the Names of Other Physician/Practitioners you currently use:  See referral list for the physicians patient is currently seeing.     Review of Systems: See  above   Objective:     General appearance: Appears stated age and thin/trim Head: Normocephalic, without obvious abnormality, atraumatic  Eyes: conj clear, EOMi PEERLA  Ears: normal TM's and external ear canals both ears  Nose: Nares normal. Septum midline. Mucosa normal. No drainage or sinus tenderness.  Throat: lips, mucosa, and tongue normal; teeth and gums normal  Neck: no adenopathy, no carotid bruit, no JVD, supple, symmetrical, trachea midline and thyroid not enlarged, symmetric, no tenderness/mass/nodules  No CVA tenderness.  Lungs: clear to auscultation bilaterally  Breasts: normal appearance, no masses or tenderness Heart: regular rate and rhythm, S1, S2 normal, no murmur, click, rub or gallop  Abdomen: soft, non-tender; bowel sounds normal; no masses, no organomegaly  Musculoskeletal: ROM normal in all joints, no crepitus, no deformity, Normal muscle strengthen. Back  is symmetric, no curvature. Skin: Skin color, texture, turgor normal. No rashes or lesions  Lymph nodes: Cervical, supraclavicular, and axillary nodes normal.  Neurologic: CN 2 -12 Normal, Normal symmetric reflexes. Normal coordination  and gait  Psych: Alert & Oriented x 3, Mood appear stable.    Assessment:    Annual wellness medicare exam   Plan:    During the course of the visit the patient was educated and counseled about appropriate screening and preventive services including:        Patient Instructions (the written plan) was given to the patient.  Medicare Attestation  I have personally reviewed:  The patient's medical and social history  Their use of alcohol, tobacco or illicit drugs  Their current medications and supplements  The patient's functional ability including ADLs,fall risks, home safety risks, cognitive, and hearing and visual impairment  Diet and physical activities  Evidence for depression or mood disorders  The patient's weight, height, BMI, and visual acuity have been  recorded in the chart. I have made referrals, counseling, and provided education to the patient based on review of the above and I have provided the patient with a written personalized care plan for preventive services.

## 2017-10-03 LAB — COMPLETE METABOLIC PANEL WITH GFR
AG Ratio: 1.6 (calc) (ref 1.0–2.5)
ALKALINE PHOSPHATASE (APISO): 75 U/L (ref 40–115)
ALT: 13 U/L (ref 9–46)
AST: 18 U/L (ref 10–35)
Albumin: 4.2 g/dL (ref 3.6–5.1)
BUN: 14 mg/dL (ref 7–25)
CO2: 30 mmol/L (ref 20–32)
Calcium: 9.3 mg/dL (ref 8.6–10.3)
Chloride: 102 mmol/L (ref 98–110)
Creat: 1.09 mg/dL (ref 0.70–1.25)
GFR, Est African American: 82 mL/min/{1.73_m2} (ref >=60)
GFR, Est Non African American: 71 mL/min/{1.73_m2} (ref >=60)
Globulin: 2.7 g/dL (calc) (ref 1.9–3.7)
Glucose, Bld: 101 mg/dL — ABNORMAL HIGH (ref 65–99)
Potassium: 4.1 mmol/L (ref 3.5–5.3)
Sodium: 137 mmol/L (ref 135–146)
Total Bilirubin: 0.7 mg/dL (ref 0.2–1.2)
Total Protein: 6.9 g/dL (ref 6.1–8.1)

## 2017-10-03 LAB — CBC WITH DIFFERENTIAL/PLATELET
Basophils Absolute: 38 cells/uL (ref 0–200)
Basophils Relative: 0.8 %
Eosinophils Absolute: 149 cells/uL (ref 15–500)
Eosinophils Relative: 3.1 %
HCT: 44.9 % (ref 38.5–50.0)
Hemoglobin: 15.4 g/dL (ref 13.2–17.1)
Lymphs Abs: 1483 cells/uL (ref 850–3900)
MCH: 31.4 pg (ref 27.0–33.0)
MCHC: 34.3 g/dL (ref 32.0–36.0)
MCV: 91.4 fL (ref 80.0–100.0)
MONOS PCT: 9.4 %
MPV: 10.4 fL (ref 7.5–12.5)
NEUTROS PCT: 55.8 %
Neutro Abs: 2678 cells/uL (ref 1500–7800)
Platelets: 203 10*3/uL (ref 140–400)
RBC: 4.91 10*6/uL (ref 4.20–5.80)
RDW: 12.1 % (ref 11.0–15.0)
Total Lymphocyte: 30.9 %
WBC mixed population: 451 cells/uL (ref 200–950)
WBC: 4.8 10*3/uL (ref 3.8–10.8)

## 2017-10-03 LAB — LIPID PANEL
CHOL/HDL RATIO: 3.2 (calc) (ref <5.0)
CHOLESTEROL: 200 mg/dL — AB (ref <200)
HDL: 63 mg/dL (ref >40)
LDL Cholesterol (Calc): 117 mg/dL (calc) — ABNORMAL HIGH
Non-HDL Cholesterol (Calc): 137 mg/dL (calc) — ABNORMAL HIGH (ref <130)
TRIGLYCERIDES: 96 mg/dL (ref <150)

## 2017-10-03 LAB — PSA: PSA: 0.6 ng/mL (ref <=4.0)

## 2017-10-03 NOTE — Patient Instructions (Signed)
It was pleasure to see you today.  Congratulations on quitting smoking.  Return in 1 year or as needed.

## 2017-11-07 ENCOUNTER — Encounter: Payer: Self-pay | Admitting: Internal Medicine

## 2018-06-13 ENCOUNTER — Ambulatory Visit (INDEPENDENT_AMBULATORY_CARE_PROVIDER_SITE_OTHER): Payer: PPO | Admitting: Internal Medicine

## 2018-06-13 ENCOUNTER — Encounter: Payer: Self-pay | Admitting: Internal Medicine

## 2018-06-13 VITALS — BP 126/80 | HR 80 | Temp 98.2°F | Ht 68.5 in | Wt 173.0 lb

## 2018-06-13 DIAGNOSIS — M545 Low back pain, unspecified: Secondary | ICD-10-CM

## 2018-06-13 MED ORDER — CYCLOBENZAPRINE HCL 10 MG PO TABS: 10.0000 mg | ORAL_TABLET | Freq: Three times a day (TID) | ORAL | 0 refills | Status: DC | PRN

## 2018-06-13 MED ORDER — PREDNISONE 10 MG PO TABS: ORAL_TABLET | ORAL | 1 refills | Status: DC

## 2018-06-13 NOTE — Patient Instructions (Signed)
Take prednisone and tapering course as directed and if no relief get prescription refill.  Flexeril 10 mg at bedtime.  Referral made to physical therapy.

## 2018-06-13 NOTE — Progress Notes (Signed)
   Subjective:    Patient ID: Charles Velazquez, male    DOB: Jan 09, 1951, 67 y.o.   MRN: [id number]  HPI  67 year old Male had onset of back pain about 3 weeks again with a swing while playing golf. Continuous right sided back pain since then. Denies heavy lifting.    Review of Systems see above     Objective:   Physical Exam  Straight leg raising is negative at 90 degrees.  Muscle strength is normal in the right lower extremity.  Range of motion of the trunk is good.  Tender over right sacral area.      Assessment & Plan:  Right back strain without sciatica  Plan: Patient would like to go to physical therapy.  Have prescribed prednisone and tapering course going from 60 mg to 0 mg over 7 days.  One refill on this prescription.  Flexeril 10 mg at bedtime #30 no refill.

## 2018-06-21 DIAGNOSIS — M6281 Muscle weakness (generalized): Secondary | ICD-10-CM | POA: Diagnosis not present

## 2018-06-21 DIAGNOSIS — M25551 Pain in right hip: Secondary | ICD-10-CM | POA: Diagnosis not present

## 2018-06-21 DIAGNOSIS — M545 Low back pain: Secondary | ICD-10-CM | POA: Diagnosis not present

## 2018-06-21 DIAGNOSIS — R293 Abnormal posture: Secondary | ICD-10-CM | POA: Diagnosis not present

## 2018-06-24 DIAGNOSIS — M545 Low back pain: Secondary | ICD-10-CM | POA: Diagnosis not present

## 2018-06-24 DIAGNOSIS — M6281 Muscle weakness (generalized): Secondary | ICD-10-CM | POA: Diagnosis not present

## 2018-06-24 DIAGNOSIS — R293 Abnormal posture: Secondary | ICD-10-CM | POA: Diagnosis not present

## 2018-06-24 DIAGNOSIS — M25551 Pain in right hip: Secondary | ICD-10-CM | POA: Diagnosis not present

## 2018-06-25 DIAGNOSIS — M545 Low back pain: Secondary | ICD-10-CM | POA: Diagnosis not present

## 2018-06-25 DIAGNOSIS — M25551 Pain in right hip: Secondary | ICD-10-CM | POA: Diagnosis not present

## 2018-06-25 DIAGNOSIS — R293 Abnormal posture: Secondary | ICD-10-CM | POA: Diagnosis not present

## 2018-06-25 DIAGNOSIS — M6281 Muscle weakness (generalized): Secondary | ICD-10-CM | POA: Diagnosis not present

## 2018-07-01 DIAGNOSIS — R293 Abnormal posture: Secondary | ICD-10-CM | POA: Diagnosis not present

## 2018-07-01 DIAGNOSIS — M545 Low back pain: Secondary | ICD-10-CM | POA: Diagnosis not present

## 2018-07-01 DIAGNOSIS — M6281 Muscle weakness (generalized): Secondary | ICD-10-CM | POA: Diagnosis not present

## 2018-07-01 DIAGNOSIS — M25551 Pain in right hip: Secondary | ICD-10-CM | POA: Diagnosis not present

## 2018-07-04 DIAGNOSIS — M6281 Muscle weakness (generalized): Secondary | ICD-10-CM | POA: Diagnosis not present

## 2018-07-04 DIAGNOSIS — M25551 Pain in right hip: Secondary | ICD-10-CM | POA: Diagnosis not present

## 2018-07-04 DIAGNOSIS — M545 Low back pain: Secondary | ICD-10-CM | POA: Diagnosis not present

## 2018-07-04 DIAGNOSIS — R293 Abnormal posture: Secondary | ICD-10-CM | POA: Diagnosis not present

## 2018-10-02 ENCOUNTER — Other Ambulatory Visit: Payer: Self-pay | Admitting: Internal Medicine

## 2018-10-02 DIAGNOSIS — E785 Hyperlipidemia, unspecified: Secondary | ICD-10-CM

## 2018-10-02 DIAGNOSIS — Z0000 Encounter for general adult medical examination without abnormal findings: Secondary | ICD-10-CM

## 2018-10-02 DIAGNOSIS — Z125 Encounter for screening for malignant neoplasm of prostate: Secondary | ICD-10-CM

## 2018-10-02 NOTE — Addendum Note (Signed)
Addended by: Mady Haagensen on: 10/02/2018 11:56 AM   Modules accepted: Orders

## 2018-10-08 ENCOUNTER — Other Ambulatory Visit: Payer: PPO | Admitting: Internal Medicine

## 2018-10-08 DIAGNOSIS — Z125 Encounter for screening for malignant neoplasm of prostate: Secondary | ICD-10-CM | POA: Diagnosis not present

## 2018-10-08 DIAGNOSIS — E785 Hyperlipidemia, unspecified: Secondary | ICD-10-CM | POA: Diagnosis not present

## 2018-10-08 DIAGNOSIS — Z0000 Encounter for general adult medical examination without abnormal findings: Secondary | ICD-10-CM

## 2018-10-09 LAB — COMPLETE METABOLIC PANEL WITH GFR
AG Ratio: 1.6 (calc) (ref 1.0–2.5)
ALBUMIN MSPROF: 4.2 g/dL (ref 3.6–5.1)
ALT: 18 U/L (ref 9–46)
AST: 20 U/L (ref 10–35)
Alkaline phosphatase (APISO): 69 U/L (ref 40–115)
BILIRUBIN TOTAL: 0.6 mg/dL (ref 0.2–1.2)
BUN: 16 mg/dL (ref 7–25)
CO2: 30 mmol/L (ref 20–32)
CREATININE: 0.99 mg/dL (ref 0.70–1.25)
Calcium: 9.7 mg/dL (ref 8.6–10.3)
Chloride: 102 mmol/L (ref 98–110)
GFR, EST AFRICAN AMERICAN: 92 mL/min/{1.73_m2} (ref >=60)
GFR, Est Non African American: 79 mL/min/{1.73_m2} (ref >=60)
GLOBULIN: 2.6 g/dL (ref 1.9–3.7)
GLUCOSE: 94 mg/dL (ref 65–99)
Potassium: 4.2 mmol/L (ref 3.5–5.3)
SODIUM: 138 mmol/L (ref 135–146)
TOTAL PROTEIN: 6.8 g/dL (ref 6.1–8.1)

## 2018-10-09 LAB — CBC WITH DIFFERENTIAL/PLATELET
BASOS ABS: 59 {cells}/uL (ref 0–200)
Basophils Relative: 1 %
Eosinophils Absolute: 153 cells/uL (ref 15–500)
Eosinophils Relative: 2.6 %
HEMATOCRIT: 45.6 % (ref 38.5–50.0)
Hemoglobin: 15.8 g/dL (ref 13.2–17.1)
LYMPHS ABS: 1375 {cells}/uL (ref 850–3900)
MCH: 31.7 pg (ref 27.0–33.0)
MCHC: 34.6 g/dL (ref 32.0–36.0)
MCV: 91.4 fL (ref 80.0–100.0)
MPV: 10.3 fL (ref 7.5–12.5)
Monocytes Relative: 7.7 %
Neutro Abs: 3859 cells/uL (ref 1500–7800)
Neutrophils Relative %: 65.4 %
Platelets: 196 10*3/uL (ref 140–400)
RBC: 4.99 10*6/uL (ref 4.20–5.80)
RDW: 12.2 % (ref 11.0–15.0)
TOTAL LYMPHOCYTE: 23.3 %
WBC: 5.9 10*3/uL (ref 3.8–10.8)
WBCMIX: 454 {cells}/uL (ref 200–950)

## 2018-10-09 LAB — LIPID PANEL
Cholesterol: 209 mg/dL — ABNORMAL HIGH (ref <200)
HDL: 58 mg/dL (ref >40)
LDL Cholesterol (Calc): 134 mg/dL (calc) — ABNORMAL HIGH
Non-HDL Cholesterol (Calc): 151 mg/dL (calc) — ABNORMAL HIGH (ref <130)
Total CHOL/HDL Ratio: 3.6 (calc) (ref <5.0)
Triglycerides: 73 mg/dL (ref <150)

## 2018-10-09 LAB — PSA: PSA: 0.6 ng/mL (ref <=4.0)

## 2018-10-10 ENCOUNTER — Ambulatory Visit (INDEPENDENT_AMBULATORY_CARE_PROVIDER_SITE_OTHER): Payer: PPO | Admitting: Internal Medicine

## 2018-10-10 ENCOUNTER — Encounter: Payer: Self-pay | Admitting: Internal Medicine

## 2018-10-10 VITALS — BP 140/70 | HR 73 | Ht 68.5 in | Wt 180.0 lb

## 2018-10-10 DIAGNOSIS — Z86010 Personal history of colonic polyps: Secondary | ICD-10-CM

## 2018-10-10 DIAGNOSIS — D692 Other nonthrombocytopenic purpura: Secondary | ICD-10-CM

## 2018-10-10 DIAGNOSIS — E7800 Pure hypercholesterolemia, unspecified: Secondary | ICD-10-CM

## 2018-10-10 DIAGNOSIS — Z23 Encounter for immunization: Secondary | ICD-10-CM | POA: Diagnosis not present

## 2018-10-10 DIAGNOSIS — Z87891 Personal history of nicotine dependence: Secondary | ICD-10-CM

## 2018-10-10 DIAGNOSIS — Z0000 Encounter for general adult medical examination without abnormal findings: Secondary | ICD-10-CM

## 2018-10-10 LAB — POCT URINALYSIS DIPSTICK
Appearance: NORMAL
Bilirubin, UA: NEGATIVE
Glucose, UA: NEGATIVE
KETONES UA: NEGATIVE
LEUKOCYTES UA: NEGATIVE
NITRITE UA: NEGATIVE
Odor: NORMAL
PROTEIN UA: NEGATIVE
RBC UA: NEGATIVE
SPEC GRAV UA: 1.01 (ref 1.010–1.025)
Urobilinogen, UA: 0.2 E.U./dL
pH, UA: 7 (ref 5.0–8.0)

## 2018-10-10 NOTE — Progress Notes (Signed)
Subjective:    Patient ID: Charles Velazquez, male    DOB: [birth year], 67 y.o.   MRN: [id number]  HPI  67 year old Male for health maintenance exam and evaluation of medical issues.  Initially presented to the office in 2014.  He had a left quadriceps tendon rupture February 2016 and had surgery by Dr. Sharol Given to repair it.  He had an episode of acute maxillary sinusitis November 2017 treated at [facility].  No known drug allergies  No chronic medications  Had tetanus immunization 2012  Had colonoscopy by Dr. Henrene Pastor with hyperplastic and adenomatous polyps December 2013  Past medical history: Numerous fractures including left wrist, right forearm, right thumb x2, several fractured ribs, history of fractured ankle twice, concussion at age 48.  History of vasectomy  Social history: He works in Architect.  He is a native of [place] but raised in [place] and attended high school in [place] before moving to [place].  He moved to [place] in 1978 with wife and 44-year-old daughter.  Smoked a pack a day for well over 40 years but has quit.  Drinks beer.  Married.  Family history: Patient thinks his sister may have had breast cancer.  Mother with history of dementia.  Father with history of vertebroplasty.  One brother age 57 with history of gout.  Another sister in good health and 3 adult daughters in good health.  Review of lab work shows a total cholesterol of 209 with an LDL of 134.  A year ago total cholesterol was 200 with an LDL of 117.  CBC and PSA were normal as is C met.  Pneumococcal 23 given today.  Prevnar was given last year.  Due for tetanus immunization next year.        Review of Systems  Respiratory: Negative.   Genitourinary: Negative.   Skin:          Neurological: Negative.   Psychiatric/Behavioral: Negative.        Objective:   Physical Exam Vitals signs reviewed.  Constitutional:      General: He is not in acute distress.  Appearance: Normal appearance. He is not ill-appearing.  HENT:     Head: Normocephalic and atraumatic.     Right Ear: Tympanic membrane normal.     Left Ear: Tympanic membrane normal.     Nose: Nose normal.     Mouth/Throat:     Pharynx: Oropharynx is clear.  Eyes:     General: No scleral icterus.       Right eye: No discharge.        Left eye: No discharge.     Pupils: Pupils are equal, round, and reactive to light.  Neck:     Musculoskeletal: Neck supple.     Comments: No thyromegaly Cardiovascular:     Rate and Rhythm: Normal rate and regular rhythm.     Pulses: Normal pulses.     Heart sounds: Normal heart sounds. No murmur.  Pulmonary:     Effort: Pulmonary effort is normal. No respiratory distress.     Breath sounds: Normal breath sounds. No wheezing or rales.  Abdominal:     General: Bowel sounds are normal.     Palpations: Abdomen is soft. There is no mass.     Tenderness: There is no abdominal tenderness. There is no rebound.  Genitourinary:    Prostate: Normal.  Musculoskeletal:     Right lower leg: No edema.     Left lower  leg: No edema.  Lymphadenopathy:     Cervical: No cervical adenopathy.  Skin:    General: Skin is warm and dry.     Comments: Senile purpura noted  Neurological:     General: No focal deficit present.     Mental Status: He is alert and oriented to person, place, and time.     Cranial Nerves: No cranial nerve deficit.     Sensory: No sensory deficit.     Motor: No weakness.     Coordination: Coordination normal.     Gait: Gait normal.     Deep Tendon Reflexes: Reflexes normal.  Psychiatric:        Mood and Affect: Mood normal.        Behavior: Behavior normal.        Thought Content: Thought content normal.        Judgment: Judgment normal.           Assessment & Plan:  Elevated LDL cholesterol-continue diet and exercise efforts.  CBC C met and PSA are within normal limits.  Pneumococcal 23 given  Tetanus immunization due  next year.  Senile purpura-discussion regarding etiology.  Watch anti-inflammatory medications and sun exposure.  History of adenomatous colon polyp  Return in 1 year or as needed.  Subjective:   Patient presents for Medicare Annual/Subsequent preventive examination.  Review Past Medical/Family/Social: See above   Risk Factors  Current exercise habits: Very active with his work and plays golf Dietary issues discussed: Low-fat low carbohydrate  Cardiac risk factors: Hyperlipidemia and history of smoking  Depression Screen  (Note: if answer to either of the following is "Yes", a more complete depression screening is indicated)   Over the past two weeks, have you felt down, depressed or hopeless? No  Over the past two weeks, have you felt little interest or pleasure in doing things? No Have you lost interest or pleasure in daily life? No Do you often feel hopeless? No Do you cry easily over simple problems? No   Activities of Daily Living  In your present state of health, do you have any difficulty performing the following activities?:   Driving? No  Managing money? No  Feeding yourself? No  Getting from bed to chair? No  Climbing a flight of stairs? No  Preparing food and eating?: No  Bathing or showering? No  Getting dressed: No  Getting to the toilet? No  Using the toilet:No  Moving around from place to place: No  In the past year have you fallen or had a near fall?:No  Are you sexually active?  Yes Do you have more than one partner? No   Hearing Difficulties: No  Do you often ask people to speak up or repeat themselves?  Yes Do you experience ringing or noises in your ears?  Yes Do you have difficulty understanding soft or whispered voices?  Yes Do you feel that you have a problem with memory? No Do you often misplace items? No    Home Safety:  Do you have a smoke alarm at your residence? Yes Do you have grab bars in the bathroom?  None Do you have throw  rugs in your house?  Yes   Cognitive Testing  Alert? Yes Normal Appearance?Yes  Oriented to person? Yes Place? Yes  Time? Yes  Recall of three objects? Yes  Can perform simple calculations? Yes  Displays appropriate judgment?Yes  Can read the correct time from a watch face?Yes   List the Names  of Other Physician/Practitioners you currently use:  See referral list for the physicians patient is currently seeing.     Review of Systems: See above  Objective:     General appearance: Appears stated age  Head: Normocephalic, without obvious abnormality, atraumatic  Eyes: conj clear, EOMi PEERLA  Ears: normal TM's and external ear canals both ears  Nose: Nares normal. Septum midline. Mucosa normal. No drainage or sinus tenderness.  Throat: lips, mucosa, and tongue normal; teeth and gums normal  Neck: no adenopathy, no carotid bruit, no JVD, supple, symmetrical, trachea midline and thyroid not enlarged, symmetric, no tenderness/mass/nodules  No CVA tenderness.  Lungs: clear to auscultation bilaterally  Breasts: normal appearance, no masses or tenderness Heart: regular rate and rhythm, S1, S2 normal, no murmur, click, rub or gallop  Abdomen: soft, non-tender; bowel sounds normal; no masses, no organomegaly  Musculoskeletal: ROM normal in all joints, no crepitus, no deformity, Normal muscle strengthen. Back  is symmetric, no curvature. Skin: Skin color, texture, turgor normal. No rashes or lesions  Lymph nodes: Cervical, supraclavicular, and axillary nodes normal.  Neurologic: CN 2 -12 Normal, Normal symmetric reflexes. Normal coordination and gait  Psych: Alert & Oriented x 3, Mood appear stable.    Assessment:    Annual wellness medicare exam   Plan:    During the course of the visit the patient was educated and counseled about appropriate screening and preventive services including:   Pneumococcal 23 given     Patient Instructions (the written plan) was given to the  patient.  Medicare Attestation  I have personally reviewed:  The patient's medical and social history  Their use of alcohol, tobacco or illicit drugs  Their current medications and supplements  The patient's functional ability including ADLs,fall risks, home safety risks, cognitive, and hearing and visual impairment  Diet and physical activities  Evidence for depression or mood disorders  The patient's weight, height, BMI, and visual acuity have been recorded in the chart. I have made referrals, counseling, and provided education to the patient based on review of the above and I have provided the patient with a written personalized care plan for preventive services.

## 2018-11-02 NOTE — Patient Instructions (Signed)
It was a pleasure to see you today.  Watch diet.  Pneumococcal 23 vaccine given today.  Follow-up in 1 year or as needed.

## 2019-04-21 ENCOUNTER — Telehealth: Payer: Self-pay | Admitting: Internal Medicine

## 2019-04-21 NOTE — Telephone Encounter (Signed)
OK to bring him in. Can test hearing here

## 2019-04-21 NOTE — Telephone Encounter (Signed)
Charles Velazquez [phone]  Charles Velazquez would like office visit to discuss the possibility of maybe needing hearing aids.

## 2019-04-25 ENCOUNTER — Encounter: Payer: Self-pay | Admitting: Internal Medicine

## 2019-04-25 ENCOUNTER — Other Ambulatory Visit: Payer: Self-pay

## 2019-04-25 ENCOUNTER — Ambulatory Visit (INDEPENDENT_AMBULATORY_CARE_PROVIDER_SITE_OTHER): Payer: PPO | Admitting: Internal Medicine

## 2019-04-25 VITALS — BP 140/72 | HR 89 | Temp 98.6°F | Resp 16 | Ht 68.5 in | Wt 179.0 lb

## 2019-04-25 DIAGNOSIS — H9193 Unspecified hearing loss, bilateral: Secondary | ICD-10-CM

## 2019-04-30 NOTE — Progress Notes (Signed)
   Subjective:    Patient ID: Charles Velazquez, male    DOB: 1951-09-26, 68 y.o.   MRN: [id number]  HPI Pleasant 68 year old Male in today for discussion about his hearing.  Wife thinks he has trouble hearing because the TV is turned up loudly at times.  His general health is good.  He was last seen in December for health maintenance exam.  He is a former smoker.  He takes no chronic medications and has no chronic medical issues.  He has an elevated LDL of 134.  He did not want to be on statin medication.    Review of Systems see above     Objective:   Physical Exam Inspection of the ears shows no blockage with cerumen.  Audioscope II was used to test his hearing today  He is able to hear all times in the right ear with the exception of 500 Hz.  He is able to hear all tones in his left ear with the exception of 4000 Hz and 500 Hz       Assessment & Plan:  High-frequency hearing loss  Plan: He will be referred to [facility] ear nose [facility] throat for further evaluation.  They have audiologist on staff.

## 2019-04-30 NOTE — Patient Instructions (Signed)
ENT referral to see audiologist

## 2019-06-02 DIAGNOSIS — H9313 Tinnitus, bilateral: Secondary | ICD-10-CM | POA: Diagnosis not present

## 2019-06-02 DIAGNOSIS — H903 Sensorineural hearing loss, bilateral: Secondary | ICD-10-CM | POA: Diagnosis not present

## 2019-08-19 DIAGNOSIS — L821 Other seborrheic keratosis: Secondary | ICD-10-CM | POA: Diagnosis not present

## 2019-08-19 DIAGNOSIS — L814 Other melanin hyperpigmentation: Secondary | ICD-10-CM | POA: Diagnosis not present

## 2019-08-19 DIAGNOSIS — L570 Actinic keratosis: Secondary | ICD-10-CM | POA: Diagnosis not present

## 2019-08-19 DIAGNOSIS — Z85828 Personal history of other malignant neoplasm of skin: Secondary | ICD-10-CM | POA: Diagnosis not present

## 2019-08-19 DIAGNOSIS — D225 Melanocytic nevi of trunk: Secondary | ICD-10-CM | POA: Diagnosis not present

## 2019-08-19 DIAGNOSIS — C44629 Squamous cell carcinoma of skin of left upper limb, including shoulder: Secondary | ICD-10-CM | POA: Diagnosis not present

## 2019-09-02 ENCOUNTER — Telehealth: Payer: Self-pay | Admitting: Internal Medicine

## 2019-09-02 NOTE — Telephone Encounter (Signed)
LVM to CB and schedule CPE and Labs due after 10/11/19

## 2019-09-03 NOTE — Telephone Encounter (Signed)
CB and schedule CPE and Labs

## 2019-09-30 ENCOUNTER — Other Ambulatory Visit: Payer: Self-pay

## 2020-01-05 DIAGNOSIS — D225 Melanocytic nevi of trunk: Secondary | ICD-10-CM | POA: Diagnosis not present

## 2020-01-05 DIAGNOSIS — L821 Other seborrheic keratosis: Secondary | ICD-10-CM | POA: Diagnosis not present

## 2020-01-05 DIAGNOSIS — L814 Other melanin hyperpigmentation: Secondary | ICD-10-CM | POA: Diagnosis not present

## 2020-01-05 DIAGNOSIS — Z85828 Personal history of other malignant neoplasm of skin: Secondary | ICD-10-CM | POA: Diagnosis not present

## 2020-01-05 DIAGNOSIS — L570 Actinic keratosis: Secondary | ICD-10-CM | POA: Diagnosis not present

## 2020-01-08 ENCOUNTER — Other Ambulatory Visit: Payer: Self-pay

## 2020-01-08 ENCOUNTER — Other Ambulatory Visit: Payer: PPO | Admitting: Internal Medicine

## 2020-01-08 ENCOUNTER — Ambulatory Visit: Payer: PPO | Attending: Internal Medicine

## 2020-01-08 DIAGNOSIS — Z125 Encounter for screening for malignant neoplasm of prostate: Secondary | ICD-10-CM

## 2020-01-08 DIAGNOSIS — E785 Hyperlipidemia, unspecified: Secondary | ICD-10-CM | POA: Diagnosis not present

## 2020-01-08 DIAGNOSIS — Z0000 Encounter for general adult medical examination without abnormal findings: Secondary | ICD-10-CM | POA: Diagnosis not present

## 2020-01-08 DIAGNOSIS — Z23 Encounter for immunization: Secondary | ICD-10-CM | POA: Insufficient documentation

## 2020-01-08 DIAGNOSIS — J449 Chronic obstructive pulmonary disease, unspecified: Secondary | ICD-10-CM | POA: Diagnosis not present

## 2020-01-08 LAB — CBC WITH DIFFERENTIAL/PLATELET
Absolute Monocytes: 456 cells/uL (ref 200–950)
Basophils Absolute: 42 cells/uL (ref 0–200)
Basophils Relative: 0.8 %
Eosinophils Absolute: 170 cells/uL (ref 15–500)
Eosinophils Relative: 3.2 %
HCT: 48.6 % (ref 38.5–50.0)
Hemoglobin: 16.7 g/dL (ref 13.2–17.1)
Lymphs Abs: 1564 cells/uL (ref 850–3900)
MCH: 31.9 pg (ref 27.0–33.0)
MCHC: 34.4 g/dL (ref 32.0–36.0)
MCV: 92.9 fL (ref 80.0–100.0)
MPV: 10.7 fL (ref 7.5–12.5)
Monocytes Relative: 8.6 %
Neutro Abs: 3069 cells/uL (ref 1500–7800)
Neutrophils Relative %: 57.9 %
Platelets: 221 10*3/uL (ref 140–400)
RBC: 5.23 10*6/uL (ref 4.20–5.80)
RDW: 12.4 % (ref 11.0–15.0)
Total Lymphocyte: 29.5 %
WBC: 5.3 10*3/uL (ref 3.8–10.8)

## 2020-01-08 LAB — COMPLETE METABOLIC PANEL WITH GFR
AG Ratio: 1.5 (calc) (ref 1.0–2.5)
ALT: 15 U/L (ref 9–46)
AST: 17 U/L (ref 10–35)
Albumin: 4.1 g/dL (ref 3.6–5.1)
Alkaline phosphatase (APISO): 85 U/L (ref 35–144)
BUN: 13 mg/dL (ref 7–25)
CO2: 29 mmol/L (ref 20–32)
Calcium: 9.9 mg/dL (ref 8.6–10.3)
Chloride: 102 mmol/L (ref 98–110)
Creat: 1.09 mg/dL (ref 0.70–1.25)
GFR, Est African American: 81 mL/min/{1.73_m2} (ref >=60)
GFR, Est Non African American: 70 mL/min/{1.73_m2} (ref >=60)
Globulin: 2.7 g/dL (calc) (ref 1.9–3.7)
Glucose, Bld: 99 mg/dL (ref 65–99)
Potassium: 4.2 mmol/L (ref 3.5–5.3)
Sodium: 137 mmol/L (ref 135–146)
Total Bilirubin: 0.8 mg/dL (ref 0.2–1.2)
Total Protein: 6.8 g/dL (ref 6.1–8.1)

## 2020-01-08 LAB — PSA: PSA: 0.7 ng/mL (ref <=4.0)

## 2020-01-08 LAB — LIPID PANEL
Cholesterol: 209 mg/dL — ABNORMAL HIGH (ref <200)
HDL: 58 mg/dL (ref >=40)
LDL Cholesterol (Calc): 134 mg/dL (calc) — ABNORMAL HIGH
Non-HDL Cholesterol (Calc): 151 mg/dL (calc) — ABNORMAL HIGH (ref <130)
Total CHOL/HDL Ratio: 3.6 (calc) (ref <5.0)
Triglycerides: 73 mg/dL (ref <150)

## 2020-01-08 NOTE — Progress Notes (Signed)
   Covid-19 Vaccination Clinic  Name:  Charles Velazquez    MRN: [id number] DOB: [birth year]  01/08/2020  Charles Velazquez was observed post Covid-19 immunization for 15 minutes without incident. He was provided with Vaccine Information Sheet and instruction to access the V-Safe system.   Charles Velazquez was instructed to call 911 with any severe reactions post vaccine: Marland Kitchen Difficulty breathing  . Swelling of face and throat  . A fast heartbeat  . A bad rash all over body  . Dizziness and weakness   Immunizations Administered    Name Date Dose VIS Date Route   Pfizer COVID-19 Vaccine 01/08/2020  3:07 PM 0.3 mL 10/17/2019 Intramuscular   Manufacturer: [place]   Lot: [id number]   [place]: [id number]

## 2020-01-12 ENCOUNTER — Other Ambulatory Visit: Payer: Self-pay

## 2020-01-12 ENCOUNTER — Encounter: Payer: Self-pay | Admitting: Internal Medicine

## 2020-01-12 ENCOUNTER — Ambulatory Visit (INDEPENDENT_AMBULATORY_CARE_PROVIDER_SITE_OTHER): Payer: PPO | Admitting: Internal Medicine

## 2020-01-12 VITALS — BP 130/80 | HR 73 | Temp 98.0°F | Ht 68.5 in | Wt 181.0 lb

## 2020-01-12 DIAGNOSIS — Z86010 Personal history of colonic polyps: Secondary | ICD-10-CM | POA: Diagnosis not present

## 2020-01-12 DIAGNOSIS — D692 Other nonthrombocytopenic purpura: Secondary | ICD-10-CM | POA: Diagnosis not present

## 2020-01-12 DIAGNOSIS — Z87891 Personal history of nicotine dependence: Secondary | ICD-10-CM

## 2020-01-12 DIAGNOSIS — E7800 Pure hypercholesterolemia, unspecified: Secondary | ICD-10-CM | POA: Diagnosis not present

## 2020-01-12 DIAGNOSIS — H903 Sensorineural hearing loss, bilateral: Secondary | ICD-10-CM | POA: Diagnosis not present

## 2020-01-12 DIAGNOSIS — Z0000 Encounter for general adult medical examination without abnormal findings: Secondary | ICD-10-CM | POA: Diagnosis not present

## 2020-01-12 LAB — POCT URINALYSIS DIPSTICK
Appearance: NEGATIVE
Bilirubin, UA: NEGATIVE
Blood, UA: NEGATIVE
Glucose, UA: NEGATIVE
Ketones, UA: NEGATIVE
Leukocytes, UA: NEGATIVE
Nitrite, UA: NEGATIVE
Odor: NEGATIVE
Protein, UA: NEGATIVE
Spec Grav, UA: 1.01 (ref 1.010–1.025)
Urobilinogen, UA: 0.2 E.U./dL
pH, UA: 6.5 (ref 5.0–8.0)

## 2020-01-12 NOTE — Progress Notes (Signed)
Subjective:    Patient ID: Charles Velazquez, male    DOB: [birth year], 69 y.o.   MRN: [id number]  HPI   69 year old Male for health maintenance exam, Medicare wellness, and evaluation of medical issues.  History of bilateral sensorineural hearing loss and saw Dr. Redmond Baseman in July 2020.  Patient was found to have significant hearing loss in both ears and hearing aids were recommended.  He was having issues with tinnitus.  Initially presented to the office for the first time in 2014.  He had a left quadriceps tendon rupture 2016 and had surgery by Dr. Sharol Given to repair it.  He had an episode of acute maxillary sinusitis November 2017 treated at [facility].  No known drug allergies  No chronic medications  Had tetanus immunization 2012  He had [facility] exam 2018.  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 Elvera Lennox in October 2020.  Had colonoscopy with Dr. Henrene Pastor with hyperplastic and serrated adenomatous polyps removed December 2013.  Needs to have repeat study.  Past medical history: Numerous fractures including left wrist right forearm right thumb x2 several fractured ribs, history of fractured ankle twice, concussion and HTN.  History of vasectomy.  Social history: He has worked in Architect.  He is a native of [place] but was raised in [place] and attended high school in [place] before moving to [place].  He moved to [place] in 1978 with wife and 108-year-old daughter.  Longstanding history of heavy smoking a pack per day for well over 40 years.  He drinks beer.  Married.  Family history: Sister perhaps with breast cancer-patient is not exactly sure.  Mother with history of dementia.  Father with history of vertebroplasty.  1 brother with history of gout.  Another sister in good health.  3 adult daughters in good health.  Has had both Prevnar and pneumococcal vaccines.  COVID-19  immunization March 4.  Labs are normal except for elevated LDL of 134.  Total cholesterol is 209.  HDL 58 and triglycerides 73.  PSA is normal.  CBC and C met are normal.    Review of Systems  Constitutional: Negative.   All other systems reviewed and are negative.      Objective:   Physical Exam Constitutional:      General: He is not in acute distress.    Appearance: Normal appearance. He is not toxic-appearing.  HENT:     Head: Normocephalic and atraumatic.     Right Ear: Tympanic membrane normal.     Left Ear: Tympanic membrane normal.     Nose: Nose normal.  Eyes:     General: No scleral icterus.       Right eye: No discharge.        Left eye: No discharge.     Extraocular Movements: Extraocular movements intact.     Conjunctiva/sclera: Conjunctivae normal.     Pupils: Pupils are equal, round, and reactive to light.  Neck:     Comments: No carotid bruits.  No thyromegaly.  No adenopathy. Cardiovascular:     Rate and Rhythm: Normal rate and regular rhythm.     Heart sounds: No murmur.  Pulmonary:     Effort: Pulmonary effort is normal. No respiratory distress.     Breath sounds: Normal breath sounds. No wheezing or rales.  Abdominal:     General: Bowel sounds are normal.     Palpations: Abdomen  is soft. There is no mass.     Tenderness: There is no abdominal tenderness. There is no rebound.  Genitourinary:    Prostate: Normal.  Musculoskeletal:     Cervical back: Neck supple.     Right lower leg: No edema.     Left lower leg: No edema.  Lymphadenopathy:     Cervical: No cervical adenopathy.  Skin:    General: Skin is warm and dry.     Findings: No rash.     Comments: Senile purpura on arms  Neurological:     General: No focal deficit present.     Mental Status: He is alert and oriented to person, place, and time.     Cranial Nerves: No cranial nerve deficit.     Motor: No weakness.     Coordination: Coordination normal.     Gait: Gait normal.    Psychiatric:        Mood and Affect: Mood normal.        Behavior: Behavior normal.        Thought Content: Thought content normal.        Judgment: Judgment normal.           Assessment & Plan:  Elevated LDL cholesterol-does not want to be on statin therapy  History of basal cell carcinoma treated by Dr. Elvera Lennox.  This was on forearm.  History of smoking longstanding-does not want to quit  Hearing loss-has been recommended to use hearing aids by ENT physician  Needs repeat colonoscopy-if he does not want to do colonoscopy will ask if he will do Cologuard.  Plan: Continue to work on diet and exercise for elevated LDL cholesterol.  Advised to quit smoking.  Is to have second COVID-19 vaccine in the near future.  Needs colonoscopy and if refuses could possibly do Cologuard study instead.  We will ask him about this.  Otherwise return in 1 year or as needed.  Subjective:   Patient presents for Medicare Annual/Subsequent preventive examination.  Review Past Medical/Family/Social:   Risk Factors  Current exercise habits: Very active in his work and plays golf Dietary issues discussed: Low-fat low carbohydrate  Cardiac risk factors: Hyperlipidemia and history of smoking  Depression Screen  (Note: if answer to either of the following is "Yes", a more complete depression screening is indicated)   Over the past two weeks, have you felt down, depressed or hopeless? No  Over the past two weeks, have you felt little interest or pleasure in doing things? No Have you lost interest or pleasure in daily life? No Do you often feel hopeless? No Do you cry easily over simple problems? No   Activities of Daily Living  In your present state of health, do you have any difficulty performing the following activities?:   Driving? No  Managing money? No  Feeding yourself? No  Getting from bed to chair? No  Climbing a flight of stairs? No  Preparing food and eating?: No  Bathing or  showering? No  Getting dressed: No  Getting to the toilet? No  Using the toilet:No  Moving around from place to place: No  In the past year have you fallen or had a near fall?:No  Are you sexually active? yes Do you have more than one partner? No   Hearing Difficulties: yes-diagnosed with hearing loss in both ears Do you often ask people to speak up or repeat themselves? yes Do you experience ringing or noises in your ears? yes Do you  have difficulty understanding soft or whispered voices? yes Do you feel that you have a problem with memory? No Do you often misplace items? No    Home Safety:  Do you have a smoke alarm at your residence? Yes Do you have grab bars in the bathroom? no Do you have throw rugs in your house? yes   Cognitive Testing  Alert? Yes Normal Appearance?Yes  Oriented to person? Yes Place? Yes  Time? Yes  Recall of three objects? Yes  Can perform simple calculations? Yes  Displays appropriate judgment?Yes  Can read the correct time from a watch face?Yes   List the Names of Other Physician/Practitioners you currently use:  See referral list for the physicians patient is currently seeing.   See above  Review of Systems: See above   Objective:     General appearance: Appears stated age  Head: Normocephalic, without obvious abnormality, atraumatic  Eyes: conj clear, EOMi PEERLA  Ears: normal TM's and external ear canals both ears  Nose: Nares normal. Septum midline. Mucosa normal. No drainage or sinus tenderness.  Throat: lips, mucosa, and tongue normal; teeth and gums normal  Neck: no adenopathy, no carotid bruit, no JVD, supple, symmetrical, trachea midline and thyroid not enlarged, symmetric, no tenderness/mass/nodules  No CVA tenderness.  Lungs: clear to auscultation bilaterally  Breasts: normal appearance, no masses or tenderness Heart: regular rate and rhythm, S1, S2 normal, no murmur, click, rub or gallop  Abdomen: soft, non-tender; bowel  sounds normal; no masses, no organomegaly  Musculoskeletal: ROM normal in all joints, no crepitus, no deformity, Normal muscle strengthen. Back  is symmetric, no curvature. Skin: Skin color, texture, turgor normal. No rashes or lesions  Lymph nodes: Cervical, supraclavicular, and axillary nodes normal.  Neurologic: CN 2 -12 Normal, Normal symmetric reflexes. Normal coordination and gait  Psych: Alert & Oriented x 3, Mood appear stable.    Assessment:    Annual wellness medicare exam   Plan:    During the course of the visit the patient was educated and counseled about appropriate screening and preventive services including:   To have second COVID-19 vaccine in the near future     Patient Instructions (the written plan) was given to the patient.  Medicare Attestation  I have personally reviewed:  The patient's medical and social history  Their use of alcohol, tobacco or illicit drugs  Their current medications and supplements  The patient's functional ability including ADLs,fall risks, home safety risks, cognitive, and hearing and visual impairment  Diet and physical activities  Evidence for depression or mood disorders  The patient's weight, height, BMI, and visual acuity have been recorded in the chart. I have made referrals, counseling, and provided education to the patient based on review of the above and I have provided the patient with a written personalized care plan for preventive services.

## 2020-01-25 NOTE — Patient Instructions (Signed)
Consider Cologuard if you do not want to do repeat colonoscopy at this point in time.  Watch diet.  Consider statin medication to lower LDL.  Have second Covid vaccine in the near future.  Return in 1 year or as needed.

## 2020-02-04 ENCOUNTER — Ambulatory Visit: Payer: PPO | Attending: Internal Medicine

## 2020-02-04 DIAGNOSIS — Z23 Encounter for immunization: Secondary | ICD-10-CM

## 2020-02-04 NOTE — Progress Notes (Signed)
   Covid-19 Vaccination Clinic  Name:  Charles Velazquez    MRN: [id number] DOB: Sep 01, 1951  02/04/2020  Charles Velazquez was observed post Covid-19 immunization for 15 minutes without incident. He was provided with Vaccine Information Sheet and instruction to access the V-Safe system.   Charles Velazquez was instructed to call 911 with any severe reactions post vaccine: Marland Kitchen Difficulty breathing  . Swelling of face and throat  . A fast heartbeat  . A bad rash all over body  . Dizziness and weakness   Immunizations Administered    Name Date Dose VIS Date Route   Pfizer COVID-19 Vaccine 02/04/2020  8:36 AM 0.3 mL 10/17/2019 Intramuscular   Manufacturer: [place]   Lot: [id number]   [place]: [id number]

## 2020-06-30 DIAGNOSIS — Z20828 Contact with and (suspected) exposure to other viral communicable diseases: Secondary | ICD-10-CM | POA: Diagnosis not present

## 2020-10-25 DIAGNOSIS — J029 Acute pharyngitis, unspecified: Secondary | ICD-10-CM | POA: Diagnosis not present

## 2020-10-25 DIAGNOSIS — Z20822 Contact with and (suspected) exposure to covid-19: Secondary | ICD-10-CM | POA: Diagnosis not present

## 2020-10-25 DIAGNOSIS — J209 Acute bronchitis, unspecified: Secondary | ICD-10-CM | POA: Diagnosis not present

## 2021-01-10 DIAGNOSIS — L570 Actinic keratosis: Secondary | ICD-10-CM | POA: Diagnosis not present

## 2021-01-10 DIAGNOSIS — L821 Other seborrheic keratosis: Secondary | ICD-10-CM | POA: Diagnosis not present

## 2021-01-10 DIAGNOSIS — D692 Other nonthrombocytopenic purpura: Secondary | ICD-10-CM | POA: Diagnosis not present

## 2021-01-10 DIAGNOSIS — Z85828 Personal history of other malignant neoplasm of skin: Secondary | ICD-10-CM | POA: Diagnosis not present

## 2021-01-10 DIAGNOSIS — L814 Other melanin hyperpigmentation: Secondary | ICD-10-CM | POA: Diagnosis not present

## 2021-01-10 DIAGNOSIS — D225 Melanocytic nevi of trunk: Secondary | ICD-10-CM | POA: Diagnosis not present

## 2021-01-14 ENCOUNTER — Other Ambulatory Visit: Payer: Self-pay

## 2021-01-14 ENCOUNTER — Other Ambulatory Visit: Payer: PPO | Admitting: Internal Medicine

## 2021-01-14 DIAGNOSIS — Z0000 Encounter for general adult medical examination without abnormal findings: Secondary | ICD-10-CM

## 2021-01-14 DIAGNOSIS — E7800 Pure hypercholesterolemia, unspecified: Secondary | ICD-10-CM

## 2021-01-14 DIAGNOSIS — E785 Hyperlipidemia, unspecified: Secondary | ICD-10-CM | POA: Diagnosis not present

## 2021-01-14 DIAGNOSIS — Z87891 Personal history of nicotine dependence: Secondary | ICD-10-CM | POA: Diagnosis not present

## 2021-01-14 DIAGNOSIS — Z86010 Personal history of colonic polyps: Secondary | ICD-10-CM

## 2021-01-14 DIAGNOSIS — H9193 Unspecified hearing loss, bilateral: Secondary | ICD-10-CM

## 2021-01-14 DIAGNOSIS — Z125 Encounter for screening for malignant neoplasm of prostate: Secondary | ICD-10-CM

## 2021-01-14 DIAGNOSIS — H903 Sensorineural hearing loss, bilateral: Secondary | ICD-10-CM | POA: Diagnosis not present

## 2021-01-14 LAB — CBC WITH DIFFERENTIAL/PLATELET
Eosinophils Relative: 3.2 %
Hemoglobin: 17.5 g/dL — ABNORMAL HIGH (ref 13.2–17.1)
MCHC: 33.8 g/dL (ref 32.0–36.0)

## 2021-01-15 LAB — CBC WITH DIFFERENTIAL/PLATELET
Absolute Monocytes: 466 cells/uL (ref 200–950)
Basophils Absolute: 48 cells/uL (ref 0–200)
Basophils Relative: 0.9 %
Eosinophils Absolute: 170 cells/uL (ref 15–500)
HCT: 51.8 % — ABNORMAL HIGH (ref 38.5–50.0)
Lymphs Abs: 1542 cells/uL (ref 850–3900)
MCH: 31.1 pg (ref 27.0–33.0)
MCV: 92 fL (ref 80.0–100.0)
MPV: 10.6 fL (ref 7.5–12.5)
Monocytes Relative: 8.8 %
Neutro Abs: 3074 cells/uL (ref 1500–7800)
Neutrophils Relative %: 58 %
Platelets: 230 10*3/uL (ref 140–400)
RBC: 5.63 10*6/uL (ref 4.20–5.80)
RDW: 12.7 % (ref 11.0–15.0)
Total Lymphocyte: 29.1 %
WBC: 5.3 10*3/uL (ref 3.8–10.8)

## 2021-01-15 LAB — LIPID PANEL
Cholesterol: 201 mg/dL — ABNORMAL HIGH
HDL: 57 mg/dL
LDL Cholesterol (Calc): 126 mg/dL — ABNORMAL HIGH
Non-HDL Cholesterol (Calc): 144 mg/dL — ABNORMAL HIGH
Total CHOL/HDL Ratio: 3.5 (calc)
Triglycerides: 85 mg/dL

## 2021-01-15 LAB — COMPLETE METABOLIC PANEL WITH GFR
AG Ratio: 1.7 (calc) (ref 1.0–2.5)
ALT: 15 U/L (ref 9–46)
AST: 15 U/L (ref 10–35)
Albumin: 4.3 g/dL (ref 3.6–5.1)
Alkaline phosphatase (APISO): 74 U/L (ref 35–144)
BUN: 15 mg/dL (ref 7–25)
CO2: 30 mmol/L (ref 20–32)
Calcium: 10.1 mg/dL (ref 8.6–10.3)
Chloride: 102 mmol/L (ref 98–110)
Creat: 1.08 mg/dL (ref 0.70–1.25)
GFR, Est African American: 81 mL/min/{1.73_m2} (ref >=60)
GFR, Est Non African American: 70 mL/min/{1.73_m2} (ref >=60)
Globulin: 2.6 g/dL (calc) (ref 1.9–3.7)
Glucose, Bld: 95 mg/dL (ref 65–99)
Potassium: 4.6 mmol/L (ref 3.5–5.3)
Sodium: 139 mmol/L (ref 135–146)
Total Bilirubin: 0.5 mg/dL (ref 0.2–1.2)
Total Protein: 6.9 g/dL (ref 6.1–8.1)

## 2021-01-15 LAB — PSA: PSA: 0.88 ng/mL

## 2021-01-17 ENCOUNTER — Ambulatory Visit (INDEPENDENT_AMBULATORY_CARE_PROVIDER_SITE_OTHER): Payer: PPO | Admitting: Internal Medicine

## 2021-01-17 ENCOUNTER — Other Ambulatory Visit: Payer: Self-pay

## 2021-01-17 ENCOUNTER — Encounter: Payer: Self-pay | Admitting: Internal Medicine

## 2021-01-17 VITALS — BP 120/68 | HR 63 | Ht 69.0 in | Wt 179.0 lb

## 2021-01-17 DIAGNOSIS — H903 Sensorineural hearing loss, bilateral: Secondary | ICD-10-CM | POA: Diagnosis not present

## 2021-01-17 DIAGNOSIS — Z86010 Personal history of colonic polyps: Secondary | ICD-10-CM | POA: Diagnosis not present

## 2021-01-17 DIAGNOSIS — H6691 Otitis media, unspecified, right ear: Secondary | ICD-10-CM | POA: Diagnosis not present

## 2021-01-17 DIAGNOSIS — Z87891 Personal history of nicotine dependence: Secondary | ICD-10-CM

## 2021-01-17 DIAGNOSIS — E7800 Pure hypercholesterolemia, unspecified: Secondary | ICD-10-CM | POA: Diagnosis not present

## 2021-01-17 DIAGNOSIS — H9193 Unspecified hearing loss, bilateral: Secondary | ICD-10-CM

## 2021-01-17 DIAGNOSIS — Z0000 Encounter for general adult medical examination without abnormal findings: Secondary | ICD-10-CM | POA: Diagnosis not present

## 2021-01-17 LAB — POCT URINALYSIS DIPSTICK
Appearance: NEGATIVE
Bilirubin, UA: NEGATIVE
Blood, UA: NEGATIVE
Glucose, UA: NEGATIVE
Ketones, UA: NEGATIVE
Leukocytes, UA: NEGATIVE
Nitrite, UA: NEGATIVE
Odor: NEGATIVE
Protein, UA: NEGATIVE
Spec Grav, UA: 1.015 (ref 1.010–1.025)
Urobilinogen, UA: 0.2 E.U./dL
pH, UA: 7.5 (ref 5.0–8.0)

## 2021-01-17 MED ORDER — AMOXICILLIN-POT CLAVULANATE 500-125 MG PO TABS: 1.0000 | ORAL_TABLET | Freq: Three times a day (TID) | ORAL | 0 refills | Status: DC

## 2021-01-17 NOTE — Patient Instructions (Addendum)
It a pleasure to see you today.  Try Augmentin 500 mg three times daily for 10 days for right ear discomfort.  We would advise you to quit smoking.  Watch fat in diet.  Return in 1 year or as needed. Referral for repeat colonoscopy to Dr. Scarlette Shorts. Does not want to be on lipid lowering therapy.

## 2021-01-17 NOTE — Progress Notes (Addendum)
Subjective:    Patient ID: Charles Velazquez, male    DOB: 10/26/1951, 70 y.o.   MRN: [id number]  HPI  70 year old Male for health maintenance exam, Medicare wellness and evaluation of medical issues.  History of bilateral sensorineural hearing loss.  Saw Dr. Redmond Baseman July 2020.  Was found to have significant hearing loss of both ears and hearing aids were recommended.  He was having issues with tinnitus at the time and still has tinnitus.  Has noticed odd sensation in his right ear over the last several weeks.  Feels that there may be some sort of blockage.  Considerable difference in right ear versus left ear when listening to television.  We will treat with Augmentin 500 mg 3 times a day for 10 days.  If not improving will refer back to ENT physician.  He initially presented to the office for the first time in 2014.  He had a left quadriceps tendon rupture in 2016 and had surgery by Dr. Sharol Given to repair it.  He had an episode of acute maxillary sinusitis November 2017 treated at [facility] urgent care.  He had previous colonoscopy by Dr. Scarlette Shorts.  Repeat study is now due.  At initial study in 2013 he had a serrated adenoma and a hyperplastic polyp.  No known drug allergies  No chronic medications.  He had welcome to Medicare exam 2018.  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 Elvera Lennox October 2020.  Past medical history: Numerous fractures including left wrist, right forearm, right thumb x2, several rib fractures, history of fractured ankle twice, concussion, hypertension.  History of vasectomy.  Declines Shingrix vaccine.  Has had COVID-19 vaccines x3.  Has had pneumococcal and Prevnar vaccines.  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  Plays golf.  He is a native of [place] but was raised in [place] and attended high school in [place] before moving to [place].  He moved to  [place] in 1978 with wife and 102-year-old daughter.  Longstanding history of heavy smoking a pack a day for well over 40 years.  He drinks beer.  He is married.  Family history: Sister perhaps with diagnosis of breast cancer evaluation patient is not exactly sure.  Mother with history of dementia.  Father with history of vertebroplasty.  1 brother with history of gout.  Another sister in good health.  3 adult daughters in good health.  Patient has history of elevated LDL.  Does not want to be on statin medication.  PSA is normal.    Review of Systems only new complaint is stuffiness/hyperacusis right ear which is new.     Objective:   Physical Exam Blood pressure 120/68 pulse 63 pulse oximetry 98% weight 179 pounds BMI 26.43  Skin is warm and dry.  No cervical adenopathy.  No carotid bruits.  Left TM is clear.  Right TM is dull but not red.  Neck is supple.  Chest is clear to auscultation without rales or wheezing.  Cardiac exam: Regular rate and rhythm normal S1 and S2 without murmurs or gallops.  Abdomen soft nondistended without hepatosplenomegaly masses or tenderness.  Prostate is normal without nodules.  No lower extremity pitting edema.  Neuro: Intact without gross focal deficits.       Assessment & Plan:   Eustachian tube dysfunction vs. Right otitis media. Try Augmentin 500 mg 3 times a day for 10 days.  If not improving refer back to ENT physician.  Health maintenance-due for repeat colonoscopy and referral will be made to Dr. Henrene Pastor.  Declines Shingrix vaccine.  Has had 3 COVID vaccines.  Medicare will pay for tetanus immunization update if he has an injury.  Has had pneumococcal vaccines.  Longstanding history of smoking-quit.  History of high-frequency hearing loss-previously seen by Dr. Redmond Baseman  Likely has COPD with history of long-term smoking but does not appear to be short of breath or have exertional dyspnea.  Elevated LDL-does not want to be on statin  medication.  Plan: Augmentin 500 mg 3 times a day for 10 days for right ear.  Call if symptoms not improving.  Return in 1 year or as needed.  Labs reviewed and are within normal limits except for elevated LDL  Subjective:   Patient presents for Medicare Annual/Subsequent preventive examination.  Review Past Medical/Family/Social: see above   Risk Factors  Current exercise habits: plays golf and is physically active Dietary issues discussed: discussion about low fat diet  Cardiac risk factors: smoking and hyperlipidemia  Depression Screen  (Note: if answer to either of the following is "Yes", a more complete depression screening is indicated)   Over the past two weeks, have you felt down, depressed or hopeless? No  Over the past two weeks, have you felt little interest or pleasure in doing things? No Have you lost interest or pleasure in daily life? No Do you often feel hopeless? No Do you cry easily over simple problems? No   Activities of Daily Living  In your present state of health, do you have any difficulty performing the following activities?:   Driving? No  Managing money? No  Feeding yourself? No  Getting from bed to chair? No  Climbing a flight of stairs? No  Preparing food and eating?: No  Bathing or showering? No  Getting dressed: No  Getting to the toilet? No  Using the toilet:No  Moving around from place to place: No  In the past year have you fallen or had a near fall?:No  Are you sexually active? No  Do you have more than one partner? No   Hearing Difficulties:  Do you often ask people to speak up or repeat themselves? Yes- hx of hearing loss has seen Dr. Redmond Baseman Do you experience ringing or noises in your ears?  Yes longstanding Do you have difficulty understanding soft or whispered voices? sometimes Do you feel that you have a problem with memory? occasionally Do you often misplace items? No    Home Safety:  Do you have a smoke alarm at your  residence? Yes Do you have grab bars in the bathroom?no Do you have throw rugs in your house? yes   Cognitive Testing  Alert? Yes Normal Appearance?Yes  Oriented to person? Yes Place? Yes  Time? Yes  Recall of three objects? Yes  Can perform simple calculations? Yes  Displays appropriate judgment?Yes  Can read the correct time from a watch face?Yes   List the Names of Other Physician/Practitioners you currently use:  See referral list for the physicians patient is currently seeing.   No ongoing Specialty Care Referral for colonoscopy   Review of Systems: see above   Objective:     General appearance: Appears stated age and trim Head: Normocephalic, without obvious abnormality, atraumatic  Eyes: conj clear, EOMi PEERLA  Ears: Right TM dull. Canals OK Nose: Nares normal. Septum midline. Mucosa normal. No drainage or sinus tenderness.  Throat: lips,  mucosa, and tongue normal; teeth and gums normal  Neck: no adenopathy, no carotid bruit, no JVD, supple, symmetrical, trachea midline and thyroid not enlarged, symmetric, no tenderness/mass/nodules  No CVA tenderness.  Lungs: clear to auscultation bilaterally  Breasts: normal appearance, no masses or tenderness, top of the pacemaker on left upper chest. Incision well-healed. It is tender.  Heart: regular rate and rhythm, S1, S2 normal, no murmur, click, rub or gallop  Abdomen: soft, non-tender; bowel sounds normal; no masses, no organomegaly  Musculoskeletal: ROM normal in all joints, no crepitus, no deformity, Normal muscle strengthen. Back  is symmetric, no curvature. Skin: Skin color, texture, turgor normal. No rashes or lesions  Lymph nodes: Cervical, supraclavicular, and axillary nodes normal.  Neurologic: CN 2 -12 Normal, Normal symmetric reflexes. Normal coordination and gait  Psych: Alert & Oriented x 3, Mood appear stable.    Assessment:    Annual wellness medicare exam   Plan:    During the course of the visit  the patient was educated and counseled about appropriate screening and preventive services including:   Declines Shingrix  Referral for repeat colonoscopy     Patient Instructions (the written plan) was given to the patient.  Medicare Attestation  I have personally reviewed:  The patient's medical and social history  Their use of alcohol, tobacco or illicit drugs  Their current medications and supplements  The patient's functional ability including ADLs,fall risks, home safety risks, cognitive, and hearing and visual impairment  Diet and physical activities  Evidence for depression or mood disorders  The patient's weight, height, BMI, and visual acuity have been recorded in the chart. I have made referrals, counseling, and provided education to the patient based on review of the above and I have provided the patient with a written personalized care plan for preventive services.

## 2021-08-06 DIAGNOSIS — S329XXA Fracture of unspecified parts of lumbosacral spine and pelvis, initial encounter for closed fracture: Secondary | ICD-10-CM

## 2021-08-06 HISTORY — DX: Fracture of unspecified parts of lumbosacral spine and pelvis, initial encounter for closed fracture: S32.9XXA

## 2021-09-05 DIAGNOSIS — M5136 Other intervertebral disc degeneration, lumbar region: Secondary | ICD-10-CM | POA: Diagnosis not present

## 2021-09-05 DIAGNOSIS — S32511A Fracture of superior rim of right pubis, initial encounter for closed fracture: Secondary | ICD-10-CM | POA: Diagnosis not present

## 2021-09-05 DIAGNOSIS — W108XXA Fall (on) (from) other stairs and steps, initial encounter: Secondary | ICD-10-CM | POA: Diagnosis not present

## 2021-09-05 DIAGNOSIS — S32591A Other specified fracture of right pubis, initial encounter for closed fracture: Secondary | ICD-10-CM | POA: Diagnosis not present

## 2021-09-05 DIAGNOSIS — M160 Bilateral primary osteoarthritis of hip: Secondary | ICD-10-CM | POA: Diagnosis not present

## 2021-09-05 DIAGNOSIS — W19XXXA Unspecified fall, initial encounter: Secondary | ICD-10-CM | POA: Diagnosis not present

## 2021-09-06 DIAGNOSIS — S32591A Other specified fracture of right pubis, initial encounter for closed fracture: Secondary | ICD-10-CM | POA: Diagnosis not present

## 2021-09-06 DIAGNOSIS — M25551 Pain in right hip: Secondary | ICD-10-CM | POA: Diagnosis not present

## 2021-09-27 DIAGNOSIS — S32511D Fracture of superior rim of right pubis, subsequent encounter for fracture with routine healing: Secondary | ICD-10-CM | POA: Diagnosis not present

## 2021-09-27 DIAGNOSIS — S32591D Other specified fracture of right pubis, subsequent encounter for fracture with routine healing: Secondary | ICD-10-CM | POA: Diagnosis not present

## 2021-09-27 DIAGNOSIS — R1031 Right lower quadrant pain: Secondary | ICD-10-CM | POA: Diagnosis not present

## 2021-10-18 DIAGNOSIS — S32511D Fracture of superior rim of right pubis, subsequent encounter for fracture with routine healing: Secondary | ICD-10-CM | POA: Diagnosis not present

## 2021-10-18 DIAGNOSIS — M8080XA Other osteoporosis with current pathological fracture, unspecified site, initial encounter for fracture: Secondary | ICD-10-CM | POA: Diagnosis not present

## 2021-10-18 DIAGNOSIS — M25551 Pain in right hip: Secondary | ICD-10-CM | POA: Diagnosis not present

## 2021-10-27 DIAGNOSIS — M8589 Other specified disorders of bone density and structure, multiple sites: Secondary | ICD-10-CM | POA: Diagnosis not present

## 2021-10-27 DIAGNOSIS — M85852 Other specified disorders of bone density and structure, left thigh: Secondary | ICD-10-CM | POA: Diagnosis not present

## 2021-11-18 DIAGNOSIS — Z7189 Other specified counseling: Secondary | ICD-10-CM | POA: Diagnosis not present

## 2021-11-18 DIAGNOSIS — Z8781 Personal history of (healed) traumatic fracture: Secondary | ICD-10-CM | POA: Diagnosis not present

## 2021-11-18 DIAGNOSIS — M8580 Other specified disorders of bone density and structure, unspecified site: Secondary | ICD-10-CM | POA: Diagnosis not present

## 2022-01-13 ENCOUNTER — Other Ambulatory Visit: Payer: Self-pay

## 2022-01-13 ENCOUNTER — Other Ambulatory Visit: Payer: PPO | Admitting: Internal Medicine

## 2022-01-13 DIAGNOSIS — Z125 Encounter for screening for malignant neoplasm of prostate: Secondary | ICD-10-CM

## 2022-01-13 DIAGNOSIS — D692 Other nonthrombocytopenic purpura: Secondary | ICD-10-CM

## 2022-01-13 DIAGNOSIS — J449 Chronic obstructive pulmonary disease, unspecified: Secondary | ICD-10-CM

## 2022-01-13 DIAGNOSIS — E7800 Pure hypercholesterolemia, unspecified: Secondary | ICD-10-CM

## 2022-01-14 LAB — PSA: PSA: 0.8 ng/mL (ref <=4.00)

## 2022-01-14 LAB — CBC WITH DIFFERENTIAL/PLATELET
Absolute Monocytes: 510 cells/uL (ref 200–950)
Basophils Absolute: 50 cells/uL (ref 0–200)
Basophils Relative: 0.9 %
Eosinophils Absolute: 179 cells/uL (ref 15–500)
Eosinophils Relative: 3.2 %
HCT: 46.6 % (ref 38.5–50.0)
Hemoglobin: 15.7 g/dL (ref 13.2–17.1)
Lymphs Abs: 1579 cells/uL (ref 850–3900)
MCH: 31.7 pg (ref 27.0–33.0)
MCHC: 33.7 g/dL (ref 32.0–36.0)
MCV: 94 fL (ref 80.0–100.0)
MPV: 10 fL (ref 7.5–12.5)
Monocytes Relative: 9.1 %
Neutro Abs: 3282 cells/uL (ref 1500–7800)
Neutrophils Relative %: 58.6 %
Platelets: 224 10*3/uL (ref 140–400)
RBC: 4.96 10*6/uL (ref 4.20–5.80)
RDW: 11.9 % (ref 11.0–15.0)
Total Lymphocyte: 28.2 %
WBC: 5.6 10*3/uL (ref 3.8–10.8)

## 2022-01-14 LAB — LIPID PANEL
Cholesterol: 193 mg/dL (ref <200)
HDL: 59 mg/dL (ref >=40)
LDL Cholesterol (Calc): 116 mg/dL (calc) — ABNORMAL HIGH
Non-HDL Cholesterol (Calc): 134 mg/dL (calc) — ABNORMAL HIGH (ref <130)
Total CHOL/HDL Ratio: 3.3 (calc) (ref <5.0)
Triglycerides: 84 mg/dL (ref <150)

## 2022-01-14 LAB — COMPLETE METABOLIC PANEL WITH GFR
AG Ratio: 1.5 (calc) (ref 1.0–2.5)
ALT: 13 U/L (ref 9–46)
AST: 18 U/L (ref 10–35)
Albumin: 4.1 g/dL (ref 3.6–5.1)
Alkaline phosphatase (APISO): 82 U/L (ref 35–144)
BUN: 17 mg/dL (ref 7–25)
CO2: 29 mmol/L (ref 20–32)
Calcium: 9.5 mg/dL (ref 8.6–10.3)
Chloride: 102 mmol/L (ref 98–110)
Creat: 0.94 mg/dL (ref 0.70–1.35)
Globulin: 2.8 g/dL (calc) (ref 1.9–3.7)
Glucose, Bld: 99 mg/dL (ref 65–99)
Potassium: 4.7 mmol/L (ref 3.5–5.3)
Sodium: 137 mmol/L (ref 135–146)
Total Bilirubin: 0.6 mg/dL (ref 0.2–1.2)
Total Protein: 6.9 g/dL (ref 6.1–8.1)
eGFR: 88 mL/min/{1.73_m2} (ref >=60)

## 2022-01-16 DIAGNOSIS — L821 Other seborrheic keratosis: Secondary | ICD-10-CM | POA: Diagnosis not present

## 2022-01-16 DIAGNOSIS — Z85828 Personal history of other malignant neoplasm of skin: Secondary | ICD-10-CM | POA: Diagnosis not present

## 2022-01-16 DIAGNOSIS — L300 Nummular dermatitis: Secondary | ICD-10-CM | POA: Diagnosis not present

## 2022-01-16 DIAGNOSIS — L570 Actinic keratosis: Secondary | ICD-10-CM | POA: Diagnosis not present

## 2022-01-17 ENCOUNTER — Ambulatory Visit (INDEPENDENT_AMBULATORY_CARE_PROVIDER_SITE_OTHER): Payer: PPO | Admitting: Internal Medicine

## 2022-01-17 ENCOUNTER — Encounter: Payer: Self-pay | Admitting: Internal Medicine

## 2022-01-17 ENCOUNTER — Other Ambulatory Visit: Payer: Self-pay

## 2022-01-17 VITALS — BP 140/70 | HR 74 | Temp 97.8°F | Ht 69.0 in | Wt 171.8 lb

## 2022-01-17 DIAGNOSIS — Z0000 Encounter for general adult medical examination without abnormal findings: Secondary | ICD-10-CM | POA: Diagnosis not present

## 2022-01-17 DIAGNOSIS — H903 Sensorineural hearing loss, bilateral: Secondary | ICD-10-CM

## 2022-01-17 DIAGNOSIS — Z87891 Personal history of nicotine dependence: Secondary | ICD-10-CM | POA: Diagnosis not present

## 2022-01-17 DIAGNOSIS — R030 Elevated blood-pressure reading, without diagnosis of hypertension: Secondary | ICD-10-CM

## 2022-01-17 DIAGNOSIS — Z86010 Personal history of colonic polyps: Secondary | ICD-10-CM | POA: Diagnosis not present

## 2022-01-17 DIAGNOSIS — Z1211 Encounter for screening for malignant neoplasm of colon: Secondary | ICD-10-CM

## 2022-01-17 DIAGNOSIS — E7800 Pure hypercholesterolemia, unspecified: Secondary | ICD-10-CM | POA: Diagnosis not present

## 2022-01-17 DIAGNOSIS — Z860101 Personal history of adenomatous and serrated colon polyps: Secondary | ICD-10-CM

## 2022-01-17 LAB — HEMOCCULT GUIAC POC 1CARD (OFFICE): Fecal Occult Blood, POC: NEGATIVE

## 2022-01-17 LAB — POCT URINALYSIS DIPSTICK
Bilirubin, UA: NEGATIVE
Blood, UA: NEGATIVE
Glucose, UA: NEGATIVE
Ketones, UA: NEGATIVE
Leukocytes, UA: NEGATIVE
Nitrite, UA: NEGATIVE
Protein, UA: NEGATIVE
Spec Grav, UA: 1.025 (ref 1.010–1.025)
Urobilinogen, UA: 0.2 E.U./dL
pH, UA: 6.5 (ref 5.0–8.0)

## 2022-01-17 NOTE — Progress Notes (Signed)
? ? ? ?Annual Wellness Visit ? ?  ? ?Patient: Charles Velazquez, Male    DOB: 1951/04/27, 71 y.o.   MRN: [id number] ?Visit Date: 01/17/2022 ? ?Chief Complaint  ?Patient presents with  ? Medicare Wellness  ? ?Subjective  ?  ?Charles Velazquez is a 71 y.o. male who presents today for his Annual Wellness Visit. ? ?HPI For health maintenance exam and evaluation of medical issues. Visited sister in [place] Summer 2022 , tested positive for COVID-19 but was not very sick.  Apparently only took Piffard vaccines in 2021.  He has had Prevnar 13 and pneumococcal 23 vaccines.  Needs to consider tetanus update.  Last given in 2010.  He will need to go to pharmacy to get that up-to-date.  Also talked with him about Shingrix vaccine and he will consider getting that at pharmacy. ? ?He had colonoscopy in 2013 and is overdue for follow-up.  This was with Dr. Scarlette Shorts.  Referral has been placed for colonoscopy. ? ?His general health is really fairly good.  He works hard and he plays golf.  LDL is 116 and was 126 a year ago.  Otherwise lipid panel is normal, c-Met and CBC are normal.  Dipstick UA is normal. ? ?History of bilateral sensorineural hearing loss.  He saw Dr. Redmond Baseman in July 2020.  Had significant hearing loss in both ears and hearing aids were recommended.  He was having issues with tinnitus at the time and still has tinnitus. ? ?He presented to the office for first time in 2014.  He had a left quadriceps tendon rupture in 2016 and had surgery by Dr. Sharol Given to repair it.  He had an episode of acute maxillary sinusitis in November 2017 treated at [facility]. ? ?No chronic medications. ? ?No known drug allergies. ? ?He had welcome to Medicare exam in 2018. ? 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 Elvera Lennox in October 2020. ? ?He had numerous fractures in the past including left wrist, right forearm, right thumb x2, several rib fractures,  history of fractured ankle twice, concussion. ? ?History of hypertension and history of vasectomy. ? ?His blood pressure right now is fairly stable at 140/70 and he should continue to monitor it.  He is currently not on antihypertensive medications and really does not want to be on any.  He will let me know if blood pressures consistently elevated. ? ?Social History  ? ?Social History Narrative  ? 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  Plays golf.  He is a native of [place] but was raised in [place] and attended high school in [place] before moving to [place].  He moved to [place] in 1978 with wife and 34-year-old daughter.  Longstanding history of heavy smoking a pack a day for well over 40 years.  He drinks beer.  He is married.  ?    ? Family history: Sister perhaps with diagnosis of breast cancer evaluation patient is not exactly sure.  Mother with history of dementia.  Father with history of vertebroplasty.  1 brother with history of gout.  Another sister in good health.  3 adult daughters in good health.  ?    ? Patient has history of elevated LDL.  Does not want to be on statin medication.  PSA is normal.  ? ? ?Patient Care Team: ?Elby Showers, MD as PCP - General (Internal Medicine) ? ?  Review of Systems no new complaints ? ? Objective  ?  ?Vitals: BP 140/70   Pulse 74   Temp 97.8 ?F (36.6 ?C) (Tympanic)   Ht _0  (1.753 m)   Wt 171 lb 12 oz (77.9 kg)   SpO2 96%   BMI 25.36 kg/m?  ? ?Physical Exam ? ?Pleasant male seen in the office in no acute distress.  Skin is warm and dry.  No cervical adenopathy, carotid bruits or thyromegaly.  Chest is clear to auscultation without rales or wheezing.  Cardiac exam: Regular rate and rhythm without ectopy or murmurs.  Abdomen is soft, nondistended without hepatosplenomegaly, masses or tenderness.  Prostate is normal.  No lower extremity pitting edema.  Neurological exam is intact without gross focal  deficits. ? ? ?Most recent functional status assessment: ?In your present state of health, do you have any difficulty performing the following activities: 01/17/2022  ?Hearing? N  ?Vision? N  ?Difficulty concentrating or making decisions? N  ?Walking or climbing stairs? N  ?Dressing or bathing? N  ?Doing errands, shopping? N  ?Preparing Food and eating ? N  ?Using the Toilet? N  ?In the past six months, have you accidently leaked urine? N  ?Do you have problems with loss of bowel control? N  ?Managing your Medications? N  ?Managing your Finances? N  ?Housekeeping or managing your Housekeeping? N  ?Some recent data might be hidden  ? ?Most recent fall risk assessment: ?Fall Risk  01/17/2022  ?Falls in the past year? 0  ?Comment -  ?Number falls in past yr: 0  ?Injury with Fall? 1  ?Risk for fall due to : History of fall(s)  ?Follow up Falls evaluation completed  ? ? Most recent depression screenings: ?PHQ 2/9 Scores 01/17/2022 01/17/2021  ?PHQ - 2 Score 0 0  ? ?Most recent cognitive screening: ?6CIT Screen 01/17/2022  ?What Year? 0 points  ?What time? 0 points  ?Count back from 20 0 points  ?Months in reverse 0 points  ?Repeat phrase 0 points  ? ? ? ? ? Assessment & Plan  ?  ? ?Annual wellness visit done today including the all of the following: ?Reviewed patient's Family Medical History ?Reviewed and updated list of patient's medical providers ?Assessment of cognitive impairment was done ?Assessed patient's functional ability ?Established a written schedule for health screening services ?Health Risk Assessent Completed and Reviewed ? ?Discussed health benefits of physical activity, and encouraged him to engage in regular exercise appropriate for his age and condition.  ?  ?History of sensorineural hearing loss-does not want hearing aids at present time ? ?Elevated blood pressure reading today-needs to monitor blood pressure at home ? ?History of smoking but has quit ? ?Needs update on tetanus immunization and suggested  Shingrix vaccine but he has previously declined. ? ?Likely has element of COPD with history of long-term smoking but currently does not appear to be short of breath and has no issues playing golf regularly. ? ?Mild elevation of LDL-he does not want to be on statin therapy. ? ?Plan: Return in 1 year or as needed. ? ?  ? ?{I, Elby Showers, MD, have reviewed all documentation for this visit. The documentation on 01/29/22 for the exam, diagnosis, procedures, and orders are all accurate and complete. ? ? ?Angus Seller, CMA  ?

## 2022-01-29 ENCOUNTER — Encounter: Payer: Self-pay | Admitting: Internal Medicine

## 2022-01-29 NOTE — Patient Instructions (Addendum)
It was a pleasure to see you today.  We are glad you are feeling well and are physically active.  Try to monitor blood pressure at home.  It is slightly elevated today.  Tetanus immunization is recommended at your pharmacy.  Return in 1 year or as needed.  Colonoscopy referral made as it is past due.  Continue exercise regimen and watch diet due to slight elevation of LDL. ?

## 2022-01-30 ENCOUNTER — Encounter: Payer: Self-pay | Admitting: Gastroenterology

## 2022-02-02 ENCOUNTER — Ambulatory Visit (AMBULATORY_SURGERY_CENTER): Payer: PPO | Admitting: *Deleted

## 2022-02-02 VITALS — Ht 69.0 in | Wt 168.0 lb

## 2022-02-02 DIAGNOSIS — Z86010 Personal history of colonic polyps: Secondary | ICD-10-CM

## 2022-02-02 MED ORDER — PEG 3350-KCL-NA BICARB-NACL 420 G PO SOLR: 4000.0000 mL | Freq: Once | ORAL | 0 refills | Status: AC

## 2022-02-02 NOTE — Progress Notes (Signed)

## 2022-02-09 ENCOUNTER — Encounter: Payer: Self-pay | Admitting: Student

## 2022-02-16 ENCOUNTER — Encounter: Payer: PPO | Admitting: Gastroenterology

## 2022-02-21 ENCOUNTER — Encounter: Payer: Self-pay | Admitting: Internal Medicine

## 2022-02-21 ENCOUNTER — Ambulatory Visit (AMBULATORY_SURGERY_CENTER): Payer: PPO | Admitting: Internal Medicine

## 2022-02-21 VITALS — BP 136/83 | HR 53 | Temp 97.7°F | Resp 10 | Ht 69.0 in | Wt 168.0 lb

## 2022-02-21 DIAGNOSIS — D122 Benign neoplasm of ascending colon: Secondary | ICD-10-CM

## 2022-02-21 DIAGNOSIS — K5140 Inflammatory polyps of colon without complications: Secondary | ICD-10-CM | POA: Diagnosis not present

## 2022-02-21 DIAGNOSIS — D124 Benign neoplasm of descending colon: Secondary | ICD-10-CM | POA: Diagnosis not present

## 2022-02-21 DIAGNOSIS — Z86010 Personal history of colonic polyps: Secondary | ICD-10-CM | POA: Diagnosis not present

## 2022-02-21 MED ORDER — SODIUM CHLORIDE 0.9 % IV SOLN: 500.0000 mL | Freq: Once | INTRAVENOUS | Status: DC

## 2022-02-21 NOTE — Progress Notes (Signed)
HISTORY OF PRESENT ILLNESS: ? ?Charles Velazquez is a 71 y.o. male with a history of multiple adenomatous colon polyps and sessile serrated polyp who presents today for surveillance colonoscopy.  Previous examinations 2004, 2008, 2013.  No active complaints. ? ?REVIEW OF SYSTEMS: ? ?All non-GI ROS negative. ? ?Past Medical History:  ?Diagnosis Date  ? GERD (gastroesophageal reflux disease)   ? occasional - uses OTC  ? Pelvis fracture, right ([place]) 08/2021  ? Pneumonia   ? ? ?Past Surgical History:  ?Procedure Laterality Date  ? COLONOSCOPY    ? QUADRICEPS TENDON REPAIR Left 12/25/2014  ? Procedure: Left Quadriceps Tendon Reconstruction;  Surgeon: Newt Minion, MD;  Location: [facility];  Service: Orthopedics;  Laterality: Left;  ? ROTATOR CUFF REPAIR    ? left   ? SHOULDER SURGERY  2012  ? VASECTOMY    ? ? ?Social History ?Sunday Corn  reports that he quit smoking about 5 weeks ago. His smoking use included cigarettes. He started smoking about 54 years ago. He has a 45.00 pack-year smoking history. He has never used smokeless tobacco. He reports current alcohol use of about 28.0 standard drinks per week. He reports current drug use. Drug: Marijuana. ? ?family history includes Dementia in his mother; Renal cancer in his brother. ? ?No Known Allergies ? ?  ? ?PHYSICAL EXAMINATION: ? ?Vital signs: BP (!) 148/70   Pulse 70   Temp 97.7 ?F (36.5 ?C)   Ht '5\' 9"'$  (1.753 m)   Wt 168 lb (76.2 kg)   SpO2 98%   BMI 24.81 kg/m?  ?General: Well-developed, well-nourished, no acute distress ?HEENT: Sclerae are anicteric, conjunctiva pink. Oral mucosa intact ?Lungs: Clear ?Heart: Regular ?Abdomen: soft, nontender, nondistended, no obvious ascites, no peritoneal signs, normal bowel sounds. No organomegaly. ?Extremities: No edema ?Psychiatric: alert and oriented x3. Cooperative  ? ? ? ?ASSESSMENT: ? ?Personal history of multiple adenomatous polyps and sessile serrated polyp and later that year for  surveillance ? ? ?PLAN: ? ? ?Surveillance colonoscopy ? ? ? ?  ?

## 2022-02-21 NOTE — Patient Instructions (Signed)
Resume previous diet and medications. Awaiting pathology results. Repeat Colonoscopy in 5 years for surveillance. ? ?YOU HAD AN ENDOSCOPIC PROCEDURE TODAY AT [facility]:   Refer to the procedure report that was given to you for any specific questions about what was found during the examination.  If the procedure report does not answer your questions, please call your gastroenterologist to clarify.  If you requested that your care partner not be given the details of your procedure findings, then the procedure report has been included in a sealed envelope for you to review at your convenience later. ? ?YOU SHOULD EXPECT: Some feelings of bloating in the abdomen. Passage of more gas than usual.  Walking can help get rid of the air that was put into your GI tract during the procedure and reduce the bloating. If you had a lower endoscopy (such as a colonoscopy or flexible sigmoidoscopy) you may notice spotting of blood in your stool or on the toilet paper. If you underwent a bowel prep for your procedure, you may not have a normal bowel movement for a few days. ? ?Please Note:  You might notice some irritation and congestion in your nose or some drainage.  This is from the oxygen used during your procedure.  There is no need for concern and it should clear up in a day or so. ? ?SYMPTOMS TO REPORT IMMEDIATELY: ? ?Following lower endoscopy (colonoscopy or flexible sigmoidoscopy): ? Excessive amounts of blood in the stool ? Significant tenderness or worsening of abdominal pains ? Swelling of the abdomen that is new, acute ? Fever of 100?F or higher ? ?For urgent or emergent issues, a gastroenterologist can be reached at any hour by calling [phone]. ?Do not use MyChart messaging for urgent concerns.  ? ? ?DIET:  We do recommend a small meal at first, but then you may proceed to your regular diet.  Drink plenty of fluids but you should avoid alcoholic beverages for 24 hours. ? ?ACTIVITY:  You should  plan to take it easy for the rest of today and you should NOT DRIVE or use heavy machinery until tomorrow (because of the sedation medicines used during the test).   ? ?FOLLOW UP: ?Our staff will call the number listed on your records 48-72 hours following your procedure to check on you and address any questions or concerns that you may have regarding the information given to you following your procedure. If we do not reach you, we will leave a message.  We will attempt to reach you two times.  During this call, we will ask if you have developed any symptoms of COVID 19. If you develop any symptoms (ie: fever, flu-like symptoms, shortness of breath, cough etc.) before then, please call [phone].  If you test positive for Covid 19 in the 2 weeks post procedure, please call and report this information to [place].   ? ?If any biopsies were taken you will be contacted by phone or by letter within the next 1-3 weeks.  Please call us at [phone] if you have not heard about the biopsies in 3 weeks.  ? ? ?SIGNATURES/CONFIDENTIALITY: ?You and/or your care partner have signed paperwork which will be entered into your electronic medical record.  These signatures attest to the fact that that the information above on your After Visit Summary has been reviewed and is understood.  Full responsibility of the confidentiality of this discharge information lies with you and/or your care-partner.  ?

## 2022-02-21 NOTE — Progress Notes (Signed)
Called to room to assist during endoscopic procedure.  Patient ID and intended procedure confirmed with present staff. Received instructions for my participation in the procedure from the performing physician.  

## 2022-02-21 NOTE — Progress Notes (Signed)
Sedate, gd SR, tolerated procedure well, VSS, report to RN 

## 2022-02-21 NOTE — Progress Notes (Signed)
Pt's states no medical or surgical changes since previsit or office visit. 

## 2022-02-21 NOTE — Op Note (Signed)
[place] ?Patient Name: Charles Velazquez ?Procedure Date: 02/21/2022 11:08 AM ?MRN: [id number] ?Endoscopist: Docia Chuck. Henrene Pastor , MD ?Age: 71 ?Referring MD:  ?Date of Birth: [birth year] ?Gender: Male ?Account #: [id number] ?Procedure:                Colonoscopy with cold snare polypectomy x 2 ?Indications:              High risk colon cancer surveillance: Personal  ?                          history of multiple (3 or more) adenomas, High risk  ?                          colon cancer surveillance: Personal history of  ?                          sessile serrated colon polyp (less than 10 mm in  ?                          size) with no dysplasia. Previous examinations  ?                          2004, 2008, 2013 ?Medicines:                Monitored Anesthesia Care ?Procedure:                Pre-Anesthesia Assessment: ?                          - Prior to the procedure, a History and Physical  ?                          was performed, and patient medications and  ?                          allergies were reviewed. The patient's tolerance of  ?                          previous anesthesia was also reviewed. The risks  ?                          and benefits of the procedure and the sedation  ?                          options and risks were discussed with the patient.  ?                          All questions were answered, and informed consent  ?                          was obtained. Prior Anticoagulants: The patient has  ?                          taken no previous anticoagulant or antiplatelet  ?  agents. ASA Grade Assessment: II - A patient with  ?                          mild systemic disease. After reviewing the risks  ?                          and benefits, the patient was deemed in  ?                          satisfactory condition to undergo the procedure. ?                          After obtaining informed consent, the colonoscope  ?                          was passed under direct  vision. Throughout the  ?                          procedure, the patient's blood pressure, pulse, and  ?                          oxygen saturations were monitored continuously. The  ?                          CF [id number] #[id number] was introduced through the anus  ?                          and advanced to the the cecum, identified by  ?                          appendiceal orifice and ileocecal valve. The  ?                          ileocecal valve, appendiceal orifice, and rectum  ?                          were photographed. The quality of the bowel  ?                          preparation was excellent. The colonoscopy was  ?                          performed without difficulty. The patient tolerated  ?                          the procedure well. The bowel preparation used was  ?                          GoLYTELY via split dose instruction. ?Scope In: 11:19:03 AM ?Scope Out: 11:44:12 AM ?Scope Withdrawal Time: 0 hours 15 minutes 5 seconds  ?Total Procedure Duration: 0 hours 25 minutes 9 seconds  ?Findings:                 Two polyps were found in the descending colon  and  ?                          ascending colon. The polyps were 3 to 5 mm in size.  ?                          These polyps were removed with a cold snare.  ?                          Resection and retrieval were complete. ?                          Multiple diverticula were found in the sigmoid  ?                          colon. There was sigmoid STENOSIS. ?                          The exam was otherwise without abnormality on  ?                          direct and retroflexion views. ?Complications:            No immediate complications. Estimated blood loss:  ?                          None. ?Estimated Blood Loss:     Estimated blood loss: none. ?Impression:               - Two 3 to 5 mm polyps in the descending colon and  ?                          in the ascending colon, removed with a cold snare.  ?                          Resected and  retrieved. ?                          - Diverticulosis in the sigmoid colon. SIGMOID  ?                          STENOSIS ?                          - The examination was otherwise normal on direct  ?                          and retroflexion views. ?Recommendation:           - Repeat colonoscopy in 5 years for surveillance.  ?                          PEDIATRIC SCOPE ?                          - Patient has a contact number available for  ?  emergencies. The signs and symptoms of potential  ?                          delayed complications were discussed with the  ?                          patient. Return to normal activities tomorrow.  ?                          Written discharge instructions were provided to the  ?                          patient. ?                          - Resume previous diet. ?                          - Continue present medications. ?                          - Await pathology results. ?Docia Chuck. Henrene Pastor, MD ?02/21/2022 11:52:51 AM ?This report has been signed electronically. ?

## 2022-02-23 ENCOUNTER — Telehealth: Payer: Self-pay | Admitting: *Deleted

## 2022-02-23 ENCOUNTER — Encounter: Payer: Self-pay | Admitting: Internal Medicine

## 2022-02-23 NOTE — Telephone Encounter (Signed)
?  Follow up Call- ? ? ?  02/21/2022  ? 10:18 AM  ?Call back number  ?Post procedure Call Back phone  # [phone]  ?Permission to leave phone message Yes  ? LMOM to call back with any questions or concerns.   ?

## 2022-02-23 NOTE — Telephone Encounter (Signed)
?  Follow up Call- ? ? ?  02/21/2022  ? 10:18 AM  ?Call back number  ?Post procedure Call Back phone  # [phone]  ?Permission to leave phone message Yes  ?  ?LMOM ?

## 2022-12-13 DIAGNOSIS — R69 Illness, unspecified: Secondary | ICD-10-CM | POA: Diagnosis not present

## 2022-12-15 DIAGNOSIS — R69 Illness, unspecified: Secondary | ICD-10-CM | POA: Diagnosis not present

## 2023-01-23 NOTE — Progress Notes (Signed)
Annual Wellness Visit    Patient Care Team: Elby Showers, MD as PCP - General (Internal Medicine)  Visit Date: 01/30/23   Chief Complaint  Patient presents with   Medicare Wellness   Annual Exam    Subjective:   Patient: Charles Velazquez, Male    DOB: 1951/05/20, 72 y.o.   MRN: [id number]  Charles Velazquez is a 72 y.o. Male who presents today for his Annual Wellness Visit. He has a history of GERD, right pelvis fracture, pneumonia.  History of hypertension. Blood pressure normal today at 130/80.   History of GERD, vasectomy.  Hemoglobin normal, RBC count/size normal. Electrolytes normal. Liver, kidney function normal. CHOL elevated at 212, LDL at 125 on 01/29/23.  Denies chest pain, shortness of breath.   PSA normal at 0.63 on 01/29/23.  Colonoscopy last completed 02/21/22. Two 3-5 mm polyps in descending, ascending colon, removed, diverticulosis in sigmoid colon, sigmoid stenosis. Examination otherwise normal. Pathology showed one serrated mucosal polyp, one tubular adenoma. Recommended repeat in 2028.   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  Plays golf.  He is a native of [place] but was raised in [place] and attended high school in [place] before moving to [place].  He moved to [place] in 1978 with wife and 24-year-old daughter.  Longstanding history of heavy smoking a pack a day for well over 40 years.  He drinks beer.  He is married.       Family history: Sister perhaps with diagnosis of breast  cancer evaluation patient is not exactly sure.  Mother with history of dementia.  Father with history of vertebroplasty.  1 brother with history of gout.  Another sister in good health.  3 adult daughters in good health.       Patient has history of elevated LDL.  Does not want to be on statin medication.  PSA is normal.     Review of Systems  Constitutional:  Negative for chills, fever, malaise/fatigue and weight loss.  HENT:  Negative for congestion, hearing loss, sinus pain and sore throat.   Eyes:  Negative for blurred vision.  Respiratory:  Positive for shortness of breath (Upon exertion). Negative for cough and hemoptysis.   Cardiovascular:  Negative for chest pain, palpitations, leg swelling and PND.  Gastrointestinal:  Negative for abdominal pain, constipation, diarrhea, heartburn, nausea and vomiting.  Genitourinary:  Negative for dysuria, frequency and urgency.  Musculoskeletal:  Negative for back pain, myalgias and neck pain.  Skin:  Negative for itching and rash.  Neurological:  Negative for dizziness, tingling, seizures, loss of consciousness and headaches.  Endo/Heme/Allergies:  Negative for polydipsia.  Psychiatric/Behavioral:  Negative for depression. The patient is not nervous/anxious.       Objective:   Vitals: BP 130/80   Pulse 79   Temp 98 F (36.7 C) (Tympanic)   Ht 5\' 9"  (  1.753 m)   Wt 171 lb 12.8 oz (77.9 kg)   SpO2 98%   BMI 25.37 kg/m   Physical Exam Vitals and nursing note reviewed.  Constitutional:      General: He is awake. He is not in acute distress.    Appearance: Normal appearance. He is not ill-appearing or toxic-appearing.  HENT:     Head: Normocephalic and atraumatic.     Right Ear: Tympanic membrane, ear canal and external ear normal.     Left Ear: Tympanic membrane, ear canal and external ear normal.     Mouth/Throat:     Pharynx: Oropharynx is clear.  Eyes:     Extraocular Movements: Extraocular movements intact.     Pupils: Pupils  are equal, round, and reactive to light.  Neck:     Thyroid: No thyroid mass, thyromegaly or thyroid tenderness.     Vascular: No carotid bruit.  Cardiovascular:     Rate and Rhythm: Normal rate and regular rhythm. No extrasystoles are present.    Pulses:          Dorsalis pedis pulses are 1+ on the right side and 1+ on the left side.     Heart sounds: Normal heart sounds. No murmur heard.    No friction rub. No gallop.  Pulmonary:     Effort: Pulmonary effort is normal.     Breath sounds: Normal breath sounds. No decreased breath sounds, wheezing, rhonchi or rales.  Chest:     Chest wall: No mass.  Abdominal:     Palpations: Abdomen is soft.     Tenderness: There is no abdominal tenderness.     Hernia: No hernia is present.  Musculoskeletal:     Cervical back: Normal range of motion.     Right lower leg: No edema.     Left lower leg: No edema.  Lymphadenopathy:     Cervical: No cervical adenopathy.     Upper Body:     Right upper body: No supraclavicular adenopathy.     Left upper body: No supraclavicular adenopathy.  Skin:    General: Skin is warm and dry.  Neurological:     General: No focal deficit present.     Mental Status: He is alert and oriented to person, place, and time. Mental status is at baseline.     Cranial Nerves: Cranial nerves 2-12 are intact.     Sensory: Sensation is intact.     Motor: Motor function is intact.     Coordination: Coordination is intact.     Gait: Gait is intact.     Deep Tendon Reflexes: Reflexes are normal and symmetric.  Psychiatric:        Attention and Perception: Attention normal.        Mood and Affect: Mood normal.        Speech: Speech normal.        Behavior: Behavior normal. Behavior is cooperative.        Thought Content: Thought content normal.        Cognition and Memory: Cognition and memory normal.        Judgment: Judgment normal.      Most recent functional status assessment:    01/30/2023    9:44 AM  In your  present state of health, do you have any difficulty performing the following activities:  Hearing? 1  Vision? 0  Difficulty concentrating or making decisions? 0  Walking or climbing stairs? 0  Dressing or bathing? 0  Doing errands, shopping? 0  Preparing Food and eating ? N  Using the Toilet? N  In the past six months, have you accidently leaked urine? N  Do you have problems with loss of bowel control? N  Managing your Medications? N  Managing your Finances? N  Housekeeping or managing your Housekeeping? N   Most recent fall risk assessment:    01/30/2023    9:43 AM  Fall Risk   Falls in the past year? 0  Number falls in past yr: 0  Injury with Fall? 0  Risk for fall due to : No Fall Risks  Follow up Falls prevention discussed    Most recent depression screenings:    01/30/2023    9:43 AM 01/17/2022    3:04 PM  PHQ 2/9 Scores  PHQ - 2 Score 0 0   Most recent cognitive screening:    01/30/2023    9:46 AM  6CIT Screen  What Year? 0 points  What month? 0 points  What time? 0 points  Count back from 20 0 points  Months in reverse 0 points  Repeat phrase 0 points  Total Score 0 points     Results:   Studies obtained and personally reviewed by me:  Colonoscopy last completed 02/21/22. Two 3-5 mm polyps in descending, ascending colon, removed, diverticulosis in sigmoid colon, sigmoid stenosis. Examination otherwise normal. Pathology showed one serrated mucosal polyp, one tubular adenoma. Recommended repeat in 2028.   Labs:       Component Value Date/Time   NA 139 01/29/2023 0910   K 4.5 01/29/2023 0910   CL 103 01/29/2023 0910   CO2 27 01/29/2023 0910   GLUCOSE 100 (H) 01/29/2023 0910   BUN 16 01/29/2023 0910   CREATININE 1.06 01/29/2023 0910   CALCIUM 9.8 01/29/2023 0910   PROT 7.2 01/29/2023 0910   ALBUMIN 3.6 12/25/2014 1113   AST 15 01/29/2023 0910   ALT 13 01/29/2023 0910   ALKPHOS 73 12/25/2014 1113   BILITOT 0.6 01/29/2023 0910   GFRNONAA 70  01/14/2021 0908   GFRAA 81 01/14/2021 0908     Lab Results  Component Value Date   WBC 5.7 01/29/2023   HGB 16.4 01/29/2023   HCT 48.4 01/29/2023   MCV 91.3 01/29/2023   PLT 221 01/29/2023    Lab Results  Component Value Date   CHOL 212 (H) 01/29/2023   HDL 64 01/29/2023   LDLCALC 125 (H) 01/29/2023   TRIG 115 01/29/2023   CHOLHDL 3.3 01/29/2023    No results found for: "HGBA1C"   No results found for: "TSH"   Lab Results  Component Value Date   PSA 0.63 01/29/2023   PSA 0.80 01/13/2022   PSA 0.88 01/14/2021    Assessment & Plan:   Hypertension: blood pressure normal today at 130/80.   Elevated cholesterol: Not interested in medications. CHOL elevated at 212, LDL at 125 on 01/29/23.  Colonoscopy: last completed 02/21/22. Two 3-5 mm polyps in descending, ascending colon, removed, diverticulosis in sigmoid colon, sigmoid stenosis. Examination otherwise normal. Pathology showed one serrated mucosal polyp, one tubular adenoma. Recommended repeat in 2028.  History of smoking. Ordered coronary calcium score.  Vaccine counseling: Will go to pharmacy for tetanus vaccine.  Return in 1 year for health maintenance exam.To have coronary calcium scoring done at [facility] location.     Annual wellness visit done today including the all of the following: Reviewed patient's Family Medical History Reviewed and updated list of patient's medical providers  Assessment of cognitive impairment was done Assessed patient's functional ability Established a written schedule for health screening [place] Completed and Reviewed  Discussed health benefits of physical activity, and encouraged him to engage in regular exercise appropriate for his age and condition.        I,Alexander Ruley,acting as a Education administrator for Elby Showers, MD.,have documented all relevant documentation on the behalf of Elby Showers, MD,as directed by  Elby Showers, MD while in the presence of  Elby Showers, MD.   I, Elby Showers, MD, have reviewed all documentation for this visit. The documentation on 01/30/23 for the exam, diagnosis, procedures, and orders are all accurate and complete.

## 2023-01-29 ENCOUNTER — Other Ambulatory Visit: Payer: Medicare HMO

## 2023-01-29 DIAGNOSIS — R030 Elevated blood-pressure reading, without diagnosis of hypertension: Secondary | ICD-10-CM | POA: Diagnosis not present

## 2023-01-29 DIAGNOSIS — E7800 Pure hypercholesterolemia, unspecified: Secondary | ICD-10-CM | POA: Diagnosis not present

## 2023-01-29 DIAGNOSIS — Z125 Encounter for screening for malignant neoplasm of prostate: Secondary | ICD-10-CM | POA: Diagnosis not present

## 2023-01-30 ENCOUNTER — Encounter: Payer: Self-pay | Admitting: Internal Medicine

## 2023-01-30 ENCOUNTER — Ambulatory Visit: Payer: Medicare HMO | Admitting: Internal Medicine

## 2023-01-30 VITALS — BP 130/80 | HR 79 | Temp 98.0°F | Ht 69.0 in | Wt 171.8 lb

## 2023-01-30 DIAGNOSIS — Z87891 Personal history of nicotine dependence: Secondary | ICD-10-CM

## 2023-01-30 DIAGNOSIS — E7800 Pure hypercholesterolemia, unspecified: Secondary | ICD-10-CM

## 2023-01-30 DIAGNOSIS — R0602 Shortness of breath: Secondary | ICD-10-CM

## 2023-01-30 DIAGNOSIS — Z0000 Encounter for general adult medical examination without abnormal findings: Secondary | ICD-10-CM

## 2023-01-30 LAB — POCT URINALYSIS DIPSTICK
Bilirubin, UA: NEGATIVE
Blood, UA: NEGATIVE
Glucose, UA: NEGATIVE
Ketones, UA: NEGATIVE
Leukocytes, UA: NEGATIVE
Nitrite, UA: NEGATIVE
Protein, UA: NEGATIVE
Spec Grav, UA: 1.01 (ref 1.010–1.025)
Urobilinogen, UA: 0.2 E.U./dL
pH, UA: 7 (ref 5.0–8.0)

## 2023-01-30 LAB — COMPLETE METABOLIC PANEL WITH GFR
AG Ratio: 1.5 (calc) (ref 1.0–2.5)
ALT: 13 U/L (ref 9–46)
AST: 15 U/L (ref 10–35)
Albumin: 4.3 g/dL (ref 3.6–5.1)
Alkaline phosphatase (APISO): 75 U/L (ref 35–144)
BUN: 16 mg/dL (ref 7–25)
CO2: 27 mmol/L (ref 20–32)
Calcium: 9.8 mg/dL (ref 8.6–10.3)
Chloride: 103 mmol/L (ref 98–110)
Creat: 1.06 mg/dL (ref 0.70–1.28)
Globulin: 2.9 g/dL (calc) (ref 1.9–3.7)
Glucose, Bld: 100 mg/dL — ABNORMAL HIGH (ref 65–99)
Potassium: 4.5 mmol/L (ref 3.5–5.3)
Sodium: 139 mmol/L (ref 135–146)
Total Bilirubin: 0.6 mg/dL (ref 0.2–1.2)
Total Protein: 7.2 g/dL (ref 6.1–8.1)
eGFR: 75 mL/min/{1.73_m2} (ref >=60)

## 2023-01-30 LAB — CBC WITH DIFFERENTIAL/PLATELET
Absolute Monocytes: 456 cells/uL (ref 200–950)
Basophils Absolute: 40 cells/uL (ref 0–200)
Basophils Relative: 0.7 %
Eosinophils Absolute: 194 cells/uL (ref 15–500)
Eosinophils Relative: 3.4 %
HCT: 48.4 % (ref 38.5–50.0)
Hemoglobin: 16.4 g/dL (ref 13.2–17.1)
Lymphs Abs: 1653 cells/uL (ref 850–3900)
MCH: 30.9 pg (ref 27.0–33.0)
MCHC: 33.9 g/dL (ref 32.0–36.0)
MCV: 91.3 fL (ref 80.0–100.0)
MPV: 10.5 fL (ref 7.5–12.5)
Monocytes Relative: 8 %
Neutro Abs: 3357 cells/uL (ref 1500–7800)
Neutrophils Relative %: 58.9 %
Platelets: 221 10*3/uL (ref 140–400)
RBC: 5.3 10*6/uL (ref 4.20–5.80)
RDW: 12.7 % (ref 11.0–15.0)
Total Lymphocyte: 29 %
WBC: 5.7 10*3/uL (ref 3.8–10.8)

## 2023-01-30 LAB — LIPID PANEL
Cholesterol: 212 mg/dL — ABNORMAL HIGH (ref <200)
HDL: 64 mg/dL (ref >=40)
LDL Cholesterol (Calc): 125 mg/dL (calc) — ABNORMAL HIGH
Non-HDL Cholesterol (Calc): 148 mg/dL (calc) — ABNORMAL HIGH (ref <130)
Total CHOL/HDL Ratio: 3.3 (calc) (ref <5.0)
Triglycerides: 115 mg/dL (ref <150)

## 2023-01-30 LAB — PSA: PSA: 0.63 ng/mL (ref <=4.00)

## 2023-01-30 NOTE — Patient Instructions (Signed)
It was a pleasure to see you today. Please call Central scheduling to schedule your coronary calcium score. He was given phone number.

## 2023-02-07 ENCOUNTER — Other Ambulatory Visit: Payer: Medicare HMO

## 2023-02-12 DIAGNOSIS — L720 Epidermal cyst: Secondary | ICD-10-CM | POA: Diagnosis not present

## 2023-02-12 DIAGNOSIS — L821 Other seborrheic keratosis: Secondary | ICD-10-CM | POA: Diagnosis not present

## 2023-02-12 DIAGNOSIS — L570 Actinic keratosis: Secondary | ICD-10-CM | POA: Diagnosis not present

## 2023-02-12 DIAGNOSIS — L814 Other melanin hyperpigmentation: Secondary | ICD-10-CM | POA: Diagnosis not present

## 2023-02-12 DIAGNOSIS — Z85828 Personal history of other malignant neoplasm of skin: Secondary | ICD-10-CM | POA: Diagnosis not present

## 2023-02-12 DIAGNOSIS — D692 Other nonthrombocytopenic purpura: Secondary | ICD-10-CM | POA: Diagnosis not present

## 2023-02-21 DIAGNOSIS — R69 Illness, unspecified: Secondary | ICD-10-CM | POA: Diagnosis not present

## 2023-03-17 DIAGNOSIS — R69 Illness, unspecified: Secondary | ICD-10-CM | POA: Diagnosis not present

## 2023-03-31 DIAGNOSIS — R69 Illness, unspecified: Secondary | ICD-10-CM | POA: Diagnosis not present

## 2023-05-04 ENCOUNTER — Telehealth: Payer: Self-pay | Admitting: Internal Medicine

## 2023-05-04 ENCOUNTER — Ambulatory Visit (INDEPENDENT_AMBULATORY_CARE_PROVIDER_SITE_OTHER): Payer: Medicare HMO | Admitting: Internal Medicine

## 2023-05-04 ENCOUNTER — Encounter: Payer: Self-pay | Admitting: Internal Medicine

## 2023-05-04 VITALS — BP 148/78 | HR 72 | Temp 98.6°F | Resp 16 | Ht 69.0 in | Wt 169.2 lb

## 2023-05-04 DIAGNOSIS — H00012 Hordeolum externum right lower eyelid: Secondary | ICD-10-CM

## 2023-05-04 DIAGNOSIS — H6501 Acute serous otitis media, right ear: Secondary | ICD-10-CM

## 2023-05-04 DIAGNOSIS — Z87891 Personal history of nicotine dependence: Secondary | ICD-10-CM | POA: Diagnosis not present

## 2023-05-04 MED ORDER — LEVOFLOXACIN 500 MG PO TABS: 500.0000 mg | ORAL_TABLET | Freq: Every day | ORAL | 0 refills | Status: AC

## 2023-05-04 MED ORDER — OFLOXACIN 0.3 % OP SOLN: 2.0000 [drp] | Freq: Four times a day (QID) | OPHTHALMIC | 0 refills | Status: DC

## 2023-05-04 NOTE — Progress Notes (Addendum)
Patient Care Team: Margaree Mackintosh, MD as PCP - General (Internal Medicine)  Visit Date: 05/04/23  Subjective:    Patient ID: Charles Velazquez , Male   DOB: 1951-04-19, 72 y.o.    MRN: [id number]   72 y.o. Male presents today for cough, headache, swollen lymph nodes in neck. Took a course of antibiotics one month ago but has had no improvement. History of seasonal allergies. Smoking 2 packs of cigarettes per week.   Has also been having recurrent hordeolum in right lower eyelid that won't seem to go away.  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  He is a native of [place] but was raised in [place] and attended high school in [place] before moving to [place].  He moved to [place] in 1978 with wife and 40-year-old daughter.  Longstanding history of heavy smoking a pack a day for well over 40 years.  He drinks beer.  He is married.       Family history: Sister perhaps with diagnosis of breast cancer evaluation patient is not exactly sure.  Mother with history of dementia.  Father with history of vertebroplasty.  1 brother with history of gout.  Another sister in good health.  3 adult daughters in good health.       Patient has history of elevated LDL.  Does not want to be on statin medication.  PSA is normal.      Review of Systems  Constitutional:  Negative for fever and malaise/fatigue.  HENT:  Negative for congestion.   Eyes:  Negative for blurred vision.       (+) Hordeolum right eye  Respiratory:   Positive for cough. Negative for shortness of breath.   Cardiovascular:  Negative for chest pain, palpitations and leg swelling.  Gastrointestinal:  Negative for vomiting.  Musculoskeletal:  Negative for back pain.  Skin:  Negative for rash.       (+) Anterior cervical nodes  Neurological:  Negative for loss of consciousness and headaches.        Objective:   Vitals: BP (!) 148/78   Pulse 72   Temp 98.6 F (37 C) (Temporal)   Resp 16   Ht 5\' 9"  (1.753 m)   Wt 169 lb 4 oz (76.8 kg)   SpO2 98%   BMI 24.99 kg/m    Physical Exam Vitals and nursing note reviewed.  Constitutional:      General: He is not in acute distress.    Appearance: Normal appearance. He is not ill-appearing.  HENT:     Head: Normocephalic and atraumatic.     Right Ear: Hearing, ear canal and external ear normal.     Left Ear: Hearing, tympanic membrane, ear canal and external ear normal.     Ears:     Comments: Right TM full without erythema. Left TM clear.  Mouth/Throat:     Comments: No masses under tongue. Eyes:     Comments: Hordeolum right eye lower lid.  Neck:     Comments: Small cervical anterior nodes.  No supraclavicular, posterior cervical nodes. Pulmonary:     Effort: Pulmonary effort is normal.  Skin:    General: Skin is warm and dry.  Neurological:     Mental Status: He is alert and oriented to person, place, and time. Mental status is at baseline.  Psychiatric:        Mood and Affect: Mood normal.        Behavior: Behavior normal.        Thought Content: Thought content normal.        Judgment: Judgment normal.       Results:   Studies obtained and personally reviewed by me:   Labs:       Component Value Date/Time   NA 139 01/29/2023 0910   K 4.5 01/29/2023 0910   CL 103 01/29/2023 0910   CO2 27 01/29/2023 0910   GLUCOSE 100 (H) 01/29/2023 0910   BUN 16 01/29/2023 0910   CREATININE 1.06 01/29/2023 0910   CALCIUM 9.8 01/29/2023 0910   PROT 7.2 01/29/2023 0910    ALBUMIN 3.6 12/25/2014 1113   AST 15 01/29/2023 0910   ALT 13 01/29/2023 0910   ALKPHOS 73 12/25/2014 1113   BILITOT 0.6 01/29/2023 0910   GFRNONAA 70 01/14/2021 0908   GFRAA 81 01/14/2021 0908     Lab Results  Component Value Date   WBC 5.7 01/29/2023   HGB 16.4 01/29/2023   HCT 48.4 01/29/2023   MCV 91.3 01/29/2023   PLT 221 01/29/2023    Lab Results  Component Value Date   CHOL 212 (H) 01/29/2023   HDL 64 01/29/2023   LDLCALC 125 (H) 01/29/2023   TRIG 115 01/29/2023   CHOLHDL 3.3 01/29/2023    No results found for: "HGBA1C"   No results found for: "TSH"   Lab Results  Component Value Date   PSA 0.63 01/29/2023   PSA 0.80 01/13/2022   PSA 0.88 01/14/2021      Assessment & Plan:   Right serous otitis media, anterior cervical lymphadenopathy: prescribed levofloxacin 500 mg daily for seven days.  Hordeolum right eye lower lid: prescribed Ocuflox 0.2% two drops in right eye four times daily.  Consider lung cancer screening with hx of smoking. Brochure mailed.  I,Alexander Ruley,acting as a Neurosurgeon for Margaree Mackintosh, MD.,have documented all relevant documentation on the behalf of Margaree Mackintosh, MD,as directed by  Margaree Mackintosh, MD while in the presence of Margaree Mackintosh, MD. I, Margaree Mackintosh, MD, have reviewed all documentation for this visit. The documentation on 05/04/23 for the exam, diagnosis, procedures, and orders are all accurate and complete.  IMargaree Mackintosh, MD, have reviewed all documentation for this visit. The documentation on 05/04/23 for the exam, diagnosis, procedures, and orders are all accurate and complete.IMargaree Mackintosh, MD, have reviewed all documentation for this visit. The documentation on 05/04/23 for the exam, diagnosis, procedures, and orders are all accurate and complete.

## 2023-05-04 NOTE — Patient Instructions (Signed)
Consider lung caner screening with hx of smoking. Try Levaquin 500 mg daily for 7 days. Call is lymph nodes enlargement does not improve with treatment.  Ocuflox ophthalmic drops prescribed for hordeolum.

## 2023-05-04 NOTE — Telephone Encounter (Signed)
Patient scheduled for 2pm today.

## 2023-05-04 NOTE — Telephone Encounter (Signed)
Glands in neck have been hurting and swollen for 10 weeks. He would like to be seen to get it checked out to see what's going on. When would you like to see him? Call back is [phone]

## 2023-06-10 DIAGNOSIS — R69 Illness, unspecified: Secondary | ICD-10-CM | POA: Diagnosis not present

## 2023-06-16 DIAGNOSIS — R69 Illness, unspecified: Secondary | ICD-10-CM | POA: Diagnosis not present

## 2023-07-11 DIAGNOSIS — R69 Illness, unspecified: Secondary | ICD-10-CM | POA: Diagnosis not present

## 2023-11-07 ENCOUNTER — Other Ambulatory Visit: Payer: Self-pay

## 2023-11-07 ENCOUNTER — Emergency Department (HOSPITAL_BASED_OUTPATIENT_CLINIC_OR_DEPARTMENT_OTHER)
Admission: EM | Admit: 2023-11-07 | Discharge: 2023-11-07 | Disposition: A | Discharge status: Home / Self Care | Payer: Medicare HMO | Attending: Emergency Medicine | Admitting: Emergency Medicine

## 2023-11-07 ENCOUNTER — Encounter (HOSPITAL_BASED_OUTPATIENT_CLINIC_OR_DEPARTMENT_OTHER): Payer: Self-pay

## 2023-11-07 DIAGNOSIS — W268XXA Contact with other sharp object(s), not elsewhere classified, initial encounter: Secondary | ICD-10-CM | POA: Insufficient documentation

## 2023-11-07 DIAGNOSIS — S61212A Laceration without foreign body of right middle finger without damage to nail, initial encounter: Secondary | ICD-10-CM | POA: Insufficient documentation

## 2023-11-07 DIAGNOSIS — S6991XA Unspecified injury of right wrist, hand and finger(s), initial encounter: Secondary | ICD-10-CM | POA: Diagnosis not present

## 2023-11-07 DIAGNOSIS — Y9389 Activity, other specified: Secondary | ICD-10-CM | POA: Diagnosis not present

## 2023-11-07 DIAGNOSIS — J449 Chronic obstructive pulmonary disease, unspecified: Secondary | ICD-10-CM | POA: Diagnosis not present

## 2023-11-07 DIAGNOSIS — Z23 Encounter for immunization: Secondary | ICD-10-CM | POA: Diagnosis not present

## 2023-11-07 MED ORDER — TETANUS-DIPHTH-ACELL PERTUSSIS 5-2.5-18.5 LF-MCG/0.5 IM SUSY
0.5000 mL | PREFILLED_SYRINGE | Freq: Once | INTRAMUSCULAR | Status: AC
  Administered 2023-11-07: 0.5 mL via INTRAMUSCULAR
  Filled 2023-11-07: qty 0.5

## 2023-11-07 MED ORDER — LIDOCAINE HCL (PF) 1 % IJ SOLN
10.0000 mL | Freq: Once | INTRAMUSCULAR | Status: AC
Start: 2023-11-07 — End: 2023-11-07
  Administered 2023-11-07: 10 mL
  Filled 2023-11-07: qty 10

## 2023-11-07 NOTE — Discharge Instructions (Addendum)
 Your laceration was repaired using 6 nonabsorbable stitches.  These should be removed in 7 to 10 days.  You may take Tylenol /Motrin for pain.  Wash area gently with warm soapy water but avoid submersion in water.  Look for signs of developing infection as we discussed and return if notice.  Please do not hesitate to return if the worrisome signs and symptoms we discussed become apparent.

## 2023-11-07 NOTE — ED Triage Notes (Signed)
 Pt was trying to cut a water line and cut his right middle finger. Tetnus not UTD.

## 2023-11-07 NOTE — ED Provider Notes (Signed)
 [place] [facility] Provider Note   CSN: [id number] Arrival date & time: 11/07/23  1109     History  Chief Complaint  Patient presents with   Laceration    Charles Velazquez is a 73 y.o. male.   Laceration   73 year old male presents emergency department with complaints of laceration.  Was cutting packs piping with a box cutter and actually cut the back of his right middle finger.  States he had difficulty getting the bleeding to stop prompting visit to the emergency department.  Denies any weakness or sensory deficits to the right hand.  Has been able to move hand without difficulty but just unable to stop bleeding.  Denies blood thinner use.  Denies trauma elsewhere.  Past medical history significant for COPD, senile purpura, GERD  Home Medications Prior to Admission medications   Medication Sig Start Date End Date Taking? Authorizing Provider  ofloxacin  (OCUFLOX ) 0.3 % ophthalmic solution Place 2 drops into the right eye 4 (four) times daily. 05/04/23   Perri Ronal PARAS, MD      Allergies    Patient has no known allergies.    Review of Systems   Review of Systems  All other systems reviewed and are negative.   Physical Exam Updated Vital Signs BP (!) 191/104 (BP Location: Left Arm)   Pulse 85   Temp 97.7 F (36.5 C) (Oral)   Resp 18   SpO2 100%  Physical Exam Vitals and nursing note reviewed.  Constitutional:      General: He is not in acute distress.    Appearance: He is well-developed.  HENT:     Head: Normocephalic and atraumatic.  Eyes:     Conjunctiva/sclera: Conjunctivae normal.  Cardiovascular:     Rate and Rhythm: Normal rate and regular rhythm.     Heart sounds: No murmur heard. Pulmonary:     Effort: Pulmonary effort is normal. No respiratory distress.     Breath sounds: Normal breath sounds.  Abdominal:     Palpations: Abdomen is soft.     Tenderness: There is no abdominal tenderness.  Musculoskeletal:         General: No swelling.       Hands:     Cervical back: Neck supple.     Comments: Full range of motion of digits of right hand.  No bony tenderness of right third digit left hand.  Laceration measuring 5.1 cm in length on the dorsal aspect of right third digit.  No obvious tendinous involvement.  Skin:    General: Skin is warm and dry.     Capillary Refill: Capillary refill takes less than 2 seconds.  Neurological:     Mental Status: He is alert.  Psychiatric:        Mood and Affect: Mood normal.     ED Results / Procedures / Treatments   Labs (all labs ordered are listed, but only abnormal results are displayed) Labs Reviewed - No data to display  EKG None  Radiology No results found.  Procedures .Laceration Repair  Date/Time: 11/07/2023 12:31 PM  Performed by: Silver Wonda LABOR, PA Authorized by: Silver Wonda LABOR, PA   Consent:    Consent obtained:  Verbal   Consent given by:  Patient   Risks, benefits, and alternatives were discussed: yes     Risks discussed:  Need for additional repair and infection   Alternatives discussed:  No treatment and delayed treatment Universal protocol:  Procedure explained and questions answered to patient or proxy's satisfaction: yes     Patient identity confirmed:  Verbally with patient and arm band Anesthesia:    Anesthesia method:  Nerve block   Block location:  Ring   Block needle gauge:  25 G   Block anesthetic:  Lidocaine  1% w/o epi   Block technique:  Ring   Block injection procedure:  Anatomic landmarks identified, anatomic landmarks palpated, negative aspiration for blood, introduced needle and incremental injection   Block outcome:  Anesthesia achieved Laceration details:    Location:  Finger   Finger location:  R long finger   Length (cm):  5.1 Pre-procedure details:    Preparation:  Patient was prepped and draped in usual sterile fashion Exploration:    Hemostasis achieved with:  Direct pressure   Imaging  outcome: foreign body not noted     Wound exploration: wound explored through full range of motion and entire depth of wound visualized     Contaminated: no   Treatment:    Area cleansed with:  Saline and chlorhexidine   Amount of cleaning:  Extensive   Irrigation solution:  Sterile water   Irrigation volume:  500cc   Irrigation method:  Syringe   Visualized foreign bodies/material removed: no     Debridement:  None   Undermining:  None   Scar revision: no   Skin repair:    Repair method:  Sutures   Suture size:  4-0   Suture material:  Prolene   Suture technique:  Simple interrupted   Number of sutures:  6 Approximation:    Approximation:  Close Repair type:    Repair type:  Simple Post-procedure details:    Dressing:  Adhesive bandage   Procedure completion:  Tolerated well, no immediate complications     Medications Ordered in ED Medications  lidocaine  (PF) (XYLOCAINE ) 1 % injection 10 mL (10 mLs Other Given by Other 11/07/23 1145)  Tdap (BOOSTRIX ) injection 0.5 mL (0.5 mLs Intramuscular Given 11/07/23 1145)    ED Course/ Medical Decision Making/ A&P                                 Medical Decision Making Risk Prescription drug management.   This patient presents to the ED for concern of laceration, this involves an extensive number of treatment options, and is a complaint that carries with it a high risk of complications and morbidity.  The differential diagnosis includes laceration, strain/sprain, dislocation, ligament/tendon injury, neurovascular compromise, foreign body retainment, other   Co morbidities that complicate the patient evaluation  See HPI   Additional history obtained:  Additional history obtained from EMR External records from outside source obtained and reviewed including hospital records   Lab Tests:  N/a   Imaging Studies ordered:  Patient declined x-ray imaging   Cardiac Monitoring: / EKG:  The patient was maintained on a  cardiac monitor.  I personally viewed and interpreted the cardiac monitored which showed an underlying rhythm of: sinus rhythm   Consultations Obtained:  N/a   Problem List / ED Course / Critical interventions / Medication management  Laceration I ordered medication including lidocaine , Tdap   Reevaluation of the patient after these medicines showed that the patient improved I have reviewed the patients home medicines and have made adjustments as needed   Social Determinants of Health:  Chronic cigarette use.  Denies illicit substance use.   Test /  Admission - Considered:  Laceration Vitals signs significant for initially hypertensive with blood pressure 191/104 at which decreased around 160 systolic post laceration repair but blood pressure was not updated by nursing staff.  Offered patient hypertensive medications but he declined.  Prefers to monitor blood pressure and follow-up with primary care.. Otherwise within normal range and stable throughout visit. 73 year old male presents emergency department with complaints of laceration.  Laceration occurred today with a clean box cutter knife when he tried to cut a plastic pipe.  Full range of motion affected digit without obvious bony tenderness, tenderness involvement.  Laceration repaired in manner as above.  Patient educated regarding proper wound care at home with repaired laceration using sutures.  Strict return precautions discussed at length if concern for developing infection.  Treatment plan discussed at length with patient and he acknowledged understanding was agreeable to said plan.  Patient overall well-appearing, afebrile in no acute distress. Worrisome signs and symptoms were discussed with the patient, and the patient acknowledged understanding to return to the ED if noticed. Patient was stable upon discharge.          Final Clinical Impression(s) / ED Diagnoses Final diagnoses:  Laceration of right middle finger  without foreign body without damage to nail, initial encounter    Rx / DC Orders ED Discharge Orders     None         Silver Wonda LABOR, [place] 11/07/23 1721    Lenor Hollering, MD 11/10/23 [phone]

## 2023-11-07 NOTE — ED Notes (Signed)
 Dressing applied per EDP verbal order

## 2023-11-09 ENCOUNTER — Telehealth: Payer: Self-pay | Admitting: Internal Medicine

## 2023-11-09 NOTE — Telephone Encounter (Signed)
 Left voice message for patient to call back and schedule appointment 7-10 days from when he was in the emergency room 11/16/2023 at 12:00 to get the stitches out.

## 2023-11-19 ENCOUNTER — Ambulatory Visit (INDEPENDENT_AMBULATORY_CARE_PROVIDER_SITE_OTHER): Payer: Medicare HMO | Admitting: Internal Medicine

## 2023-11-19 ENCOUNTER — Encounter: Payer: Self-pay | Admitting: Internal Medicine

## 2023-11-19 VITALS — BP 130/80 | HR 70 | Temp 97.9°F | Ht 69.0 in | Wt 177.0 lb

## 2023-11-19 DIAGNOSIS — S61219D Laceration without foreign body of unspecified finger without damage to nail, subsequent encounter: Secondary | ICD-10-CM

## 2023-11-19 NOTE — Progress Notes (Signed)
 Patient Care Team: Perri Charles PARAS, MD as PCP - General (Internal Medicine)  Visit Date: 11/19/23  Subjective:   Chief Complaint  Patient presents with   Suture / Staple Removal   Patient PI:Charles Velazquez,Male DOB:[birth year],73 y.o. FMW:[id number]   73 y.o. Male presents today for ED follow-up from 11/07/23 with Laceration of Right Middle Finger. Presented to ED following an injury to his right 3rd finger while using a box cutter. No weakness or sensory deficits. Received 6 sutures to close his wound, Lidocaine  for pain, and updated his Tdap vaccination. He reports that he removed 3 of his loose stitches himself and the others were removed spontaneously while he has been working. Denies any weakness or numbness.      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  Plays golf.  He is a native of [place]  but was raised in [place]  and attended high school in [place] before moving to [place] .  He moved to [place] in 1978 with wife and 50-year-old daughter.  Longstanding history of heavy smoking a pack a day for well over 40 years.  He drinks beer.  He is married.       Family history: Sister perhaps with diagnosis of breast cancer evaluation patient is not exactly sure.  Mother with history of dementia.  Father with history of vertebroplasty.  1 brother with history of gout.  Another sister in good health.  3 adult daughters in good health.       Patient has history of elevated LDL.  Does not want to be on statin medication.  PSA is  normal.   Review of Systems  Constitutional:  Negative for fever and malaise/fatigue.  HENT:  Negative for congestion.   Eyes:  Negative for blurred vision.  Respiratory:  Negative for cough and shortness of breath.   Cardiovascular:  Negative for chest pain, palpitations and leg swelling.  Gastrointestinal:  Negative for vomiting.  Musculoskeletal:  Negative for back pain.  Skin:  Negative for rash.  Neurological:  Negative for focal weakness, loss of consciousness, weakness and headaches.     Objective:  Vitals: BP 130/80   Pulse 70   Temp 97.9 F (36.6 C)   Ht 5' 9 (1.753 m)   Wt 177 lb (80.3 kg)   SpO2 98%   BMI 26.14 kg/m   Physical Exam Vitals and nursing note reviewed.  Constitutional:      General: He is not in acute distress.    Appearance: Normal appearance. He is not ill-appearing.  HENT:     Head: Normocephalic and atraumatic.  Pulmonary:     Effort: Pulmonary effort is normal.  Skin:    General: Skin is warm and dry.  Neurological:     Mental Status: He is alert and oriented to person, place, and time. Mental status is at baseline.  Psychiatric:        Mood  and Affect: Mood normal.        Behavior: Behavior normal.        Thought Content: Thought content normal.        Judgment: Judgment normal.     Results:  Studies Obtained And Personally Reviewed By Me: Labs:     Component Value Date/Time   NA 139 01/29/2023 0910   K 4.5 01/29/2023 0910   CL 103 01/29/2023 0910   CO2 27 01/29/2023 0910   GLUCOSE 100 (H) 01/29/2023 0910   BUN 16 01/29/2023 0910   CREATININE 1.06 01/29/2023 0910   CALCIUM 9.8 01/29/2023 0910   PROT 7.2 01/29/2023 0910   ALBUMIN 3.6 12/25/2014 1113   AST 15 01/29/2023 0910   ALT 13 01/29/2023 0910   ALKPHOS 73 12/25/2014 1113   BILITOT 0.6 01/29/2023 0910   GFRNONAA 70 01/14/2021 0908   GFRAA 81 01/14/2021 0908    Lab Results  Component Value Date   WBC 5.7 01/29/2023   HGB 16.4 01/29/2023   HCT 48.4 01/29/2023    MCV 91.3 01/29/2023   PLT 221 01/29/2023   Lab Results  Component Value Date   CHOL 212 (H) 01/29/2023   HDL 64 01/29/2023   LDLCALC 125 (H) 01/29/2023   TRIG 115 01/29/2023   CHOLHDL 3.3 01/29/2023    Lab Results  Component Value Date   PSA 0.63 01/29/2023   PSA 0.80 01/13/2022   PSA 0.88 01/14/2021   Assessment & Plan:  Follow-Up Encounter from 11/07/23 with Laceration of Right Middle Finger: stiches removed manually by patient before appointment - he notes that there were 6 x 4-0 Prolene sutures. Wound healing as expected. No concerns. Note reviewed from ED. No residual effects from laceration. No evidence of bacterial infection of decreased ROM or decreased finger muscle strength. Good result. Pt is pleased.  I,Emily Lagle,acting as a neurosurgeon for Charles JINNY Hailstone, MD.,have documented all relevant documentation on the behalf of Charles JINNY Hailstone, MD,as directed by  Charles JINNY Hailstone, MD while in the presence of Charles JINNY Hailstone, MD.   I, Charles JINNY Hailstone, MD, have reviewed all documentation for this visit. The documentation on 11/19/23 for the exam, diagnosis, procedures, and orders are all accurate and complete.

## 2023-11-19 NOTE — Patient Instructions (Signed)
 Finger laceration well healed, no evidence of infection. Muscle strength and range of motion is normal.

## 2024-01-31 ENCOUNTER — Other Ambulatory Visit: Payer: Medicare HMO

## 2024-01-31 DIAGNOSIS — Z0000 Encounter for general adult medical examination without abnormal findings: Secondary | ICD-10-CM

## 2024-01-31 DIAGNOSIS — E7800 Pure hypercholesterolemia, unspecified: Secondary | ICD-10-CM

## 2024-01-31 NOTE — Progress Notes (Signed)
 Annual Wellness Visit   Patient Care Team: Margaree Mackintosh, MD as PCP - General (Internal Medicine)  Visit Date: 02/01/24   Chief Complaint  Patient presents with   Annual Exam   Medicare Wellness   Subjective:  Patient: Charles Velazquez, Male DOB: [birth year], 73 y.o. MRN: [id number]  Charles Velazquez is a 73 y.o. Male who presents today for his Annual Wellness Visit. Patient has history of GERD, Right Pelvis Fracture, Pneumonia.  He says that recently he has been noticing more floaters in his vision, described as a "black flash going down" in the lateral aspect of his right eye. Says that he hasn't had an eye exam recently. Recommended Dr. Dione Booze, Ophthalmologist.   On exam, there is some fluid behind his right TM, and he does note some post-nasal drainage, which he attributes to seasonal allergies.   History of Bilateral Sensorineural Hearing Loss seen by Dr. Jenne Pane in July 2020. He had significant hearing loss in both ears and hearing aids were recommended. At the time was having issues with tinnitus and still has tinnitus.  History of Hypertension Blood Pressure: normotensive today at 136/86.  History of Hyperlipidemia 01/06/2024 Lipid Panel, compared 01/2023: Cholesterol 221, elevated from 212; LDL 141, elevated from 125.   Labs 01/31/2024 CBC: WNL CMP: WNL   Colonoscopy 02/21/2022. Two 3-5 mm Polyps removed from descending, ascending colon (one serrated mucosal polyp, one tubular adenoma per pathology); Diverticulosis in sigmoid colon; Sigmoid stenosis; otherwise normal with recommended repeat in 2028.  PSA  0.65  01/31/2024    Vaccine Counseling: Due for Flu, Covid-19, and Shingles 1/2; UTD on PNA and Tdap. Past Medical History:  Diagnosis Date   GERD (gastroesophageal reflux disease)    occasional - uses OTC   Pelvis fracture, right (HCC) 08/2021   Pneumonia   Medical/Surgical History Narrative:  No know drug allergies.   2020 - Squamous Cell Carcinoma removed from Left  Knee and Central Anterior Forearm at [facility] dermatology by Dr. Doreen Beam in October.  2018 - Welcome to Medicare  Exam   2017 - Acute Maxillary Sinusitis in November, treated at [facility].   2016 - Left Quadriceps Tendon Rupture and had surgery by Dr. Lajoyce Corners to repair it. Hx of Vasectomy noted.   2014 - Presented to the office for first time   Other - Hx of: numerous Fractures in the past, including Left Wrist, Right Forearm, Right Thumb x2, several Ribs, Ankle x2; Concussion; GERD. 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  Plays golf.  He is a native of [place] but was raised in [place] and attended high school in [place] before moving to [place].  He moved to [place] in 1978 with wife and 2-year-old daughter.  Longstanding history of heavy smoking a pack a day for well over 40 years, but has quit.  He drinks beer.  He is married.       Family history: Sister perhaps with diagnosis of breast cancer evaluation patient is not exactly sure.  Mother with history of dementia.  Father  with history of vertebroplasty.  1 brother with history of gout.  Another sister in good health.  3 adult daughters in good health.       Patient has history of elevated LDL.  Does not want to be on statin medication.  PSA is normal.   Review of Systems  Constitutional:  Negative for chills, fever, malaise/fatigue and weight loss.  HENT:  Negative for hearing loss, sinus pain and sore throat.        (+) Post-Nasal Drainage  Eyes:        (+) Floaters/Vision Changes - right lateral aspect  Respiratory:  Negative for cough, hemoptysis and shortness of breath.    Cardiovascular:  Negative for chest pain, palpitations, leg swelling and PND.  Gastrointestinal:  Negative for abdominal pain, constipation, diarrhea, heartburn, nausea and vomiting.  Genitourinary:  Negative for dysuria, frequency and urgency.  Musculoskeletal:  Negative for back pain, myalgias and neck pain.  Skin:  Negative for itching and rash.  Neurological:  Negative for dizziness, tingling, seizures and headaches.  Endo/Heme/Allergies:  Negative for polydipsia.  Psychiatric/Behavioral:  Negative for depression. The patient is not nervous/anxious.     Objective:  Vitals: BP 136/86 (BP Location: Left Arm, Patient Position: Sitting, Cuff Size: Normal)   Pulse 71   Temp 98.8 F (37.1 C) (Temporal)   Resp 12   Ht 5\' 9"  (1.753 m)   Wt 175 lb 1.9 oz (79.4 kg)   SpO2 97%   BMI 25.86 kg/m  Physical Exam Vitals and nursing note reviewed.  Constitutional:      General: He is awake. He is not in acute distress.    Appearance: Normal appearance. He is not ill-appearing or toxic-appearing.  HENT:     Head: Normocephalic and atraumatic.     Right Ear: Ear canal and external ear normal.     Left Ear: Tympanic membrane, ear canal and external ear normal.     Ears:     Comments: Fluid behind Right TM    Mouth/Throat:     Pharynx: Oropharynx is clear.  Eyes:     Extraocular Movements: Extraocular movements intact.     Pupils: Pupils are equal, round, and reactive to light.  Neck:     Thyroid: No thyroid mass, thyromegaly or thyroid tenderness.     Vascular: No carotid bruit.  Cardiovascular:     Rate and Rhythm: Normal rate and regular rhythm. No extrasystoles are present.    Pulses:          Dorsalis pedis pulses are 0 on the right side and 0 on the left side.       Posterior tibial pulses are 2+ on the right side and 2+ on the left side.     Heart sounds: Normal heart sounds. No murmur heard.    No friction rub. No gallop.  Pulmonary:     Effort: Pulmonary effort is normal.      Breath sounds: Normal breath sounds. No decreased breath sounds, wheezing, rhonchi or rales.  Chest:     Chest wall: No mass.  Abdominal:     Palpations: Abdomen is soft. There is no hepatomegaly, splenomegaly or mass.     Tenderness: There is no abdominal tenderness.     Hernia: No hernia is present.  Genitourinary:    Prostate: Normal. Not enlarged, not tender and no nodules present.     Rectum: Guaiac result negative.  Musculoskeletal:     Cervical back: Normal range of motion.  Right lower leg: No edema.     Left lower leg: No edema.  Lymphadenopathy:     Cervical: No cervical adenopathy.     Upper Body:     Right upper body: No supraclavicular adenopathy.     Left upper body: No supraclavicular adenopathy.  Skin:    General: Skin is warm and dry.     Findings: Abrasion (right arm, from carrying a door according to him) present.     Comments: Seborrheic keratoses on superior right clavicle and scattered on his face.  Neurological:     General: No focal deficit present.     Mental Status: He is alert and oriented to person, place, and time. Mental status is at baseline.     Cranial Nerves: Cranial nerves 2-12 are intact.     Sensory: Sensation is intact.     Motor: Motor function is intact.     Coordination: Coordination is intact.     Gait: Gait is intact.     Deep Tendon Reflexes: Reflexes are normal and symmetric.  Psychiatric:        Attention and Perception: Attention normal.        Mood and Affect: Mood normal.        Speech: Speech normal.        Behavior: Behavior normal. Behavior is cooperative.        Thought Content: Thought content normal.        Cognition and Memory: Cognition and memory normal.        Judgment: Judgment normal.   Most Recent Functional Status Assessment:    02/01/2024   10:01 AM  In your present state of health, do you have any difficulty performing the following activities:  Hearing? 0  Vision? 0  Difficulty concentrating or  making decisions? 1  Comment trouble remembering names  Dressing or bathing? 0  Doing errands, shopping? 0  Preparing Food and eating ? N  Using the Toilet? N  In the past six months, have you accidently leaked urine? N  Do you have problems with loss of bowel control? N  Managing your Medications? N  Managing your Finances? N  Housekeeping or managing your Housekeeping? N   Most Recent Fall Risk Assessment:    02/01/2024   10:08 AM  Fall Risk   Falls in the past year? 0  Number falls in past yr: 0  Injury with Fall? 0  Risk for fall due to : No Fall Risks   Most Recent Depression Screenings:    02/01/2024   10:08 AM 01/30/2023    9:43 AM  PHQ 2/9 Scores  PHQ - 2 Score 0 0   Most Recent Cognitive Screening:    01/30/2023    9:46 AM  6CIT Screen  What Year? 0 points  What month? 0 points  What time? 0 points  Count back from 20 0 points  Months in reverse 0 points  Repeat phrase 0 points  Total Score 0 points   Results:  Studies Obtained And Personally Reviewed By Me:  Colonoscopy 02/21/2022. Two 3-5 mm Polyps removed from descending, ascending colon (one serrated mucosal polyp, one tubular adenoma per pathology); Diverticulosis in sigmoid colon; Sigmoid stenosis; otherwise normal with recommended repeat in 2028.  Labs:     Component Value Date/Time   NA 137 01/31/2024 0923   K 5.0 01/31/2024 0923   CL 101 01/31/2024 0923   CO2 29 01/31/2024 0923   GLUCOSE 96 01/31/2024 0923   BUN  15 01/31/2024 0923   CREATININE 1.13 01/31/2024 0923   CALCIUM 9.8 01/31/2024 0923   PROT 6.9 01/31/2024 0923   ALBUMIN 3.6 12/25/2014 1113   AST 15 01/31/2024 0923   ALT 15 01/31/2024 0923   ALKPHOS 73 12/25/2014 1113   BILITOT 0.7 01/31/2024 0923   GFRNONAA 70 01/14/2021 0908   GFRAA 81 01/14/2021 0908    Lab Results  Component Value Date   WBC 5.2 01/31/2024   HGB 16.3 01/31/2024   HCT 47.7 01/31/2024   MCV 92.4 01/31/2024   PLT 209 01/31/2024   Lab Results   Component Value Date   CHOL 221 (H) 01/31/2024   HDL 56 01/31/2024   LDLCALC 141 (H) 01/31/2024   TRIG 119 01/31/2024   CHOLHDL 3.9 01/31/2024    Lab Results  Component Value Date   PSA 0.65 01/31/2024   PSA 0.63 01/29/2023   PSA 0.80 01/13/2022     Assessment & Plan:  Other Labs Reviewed today: CBC: WNL CMP: WNL  Visual Changes in lateral aspect of right eye, described as a "black flash going down". Hasn't had an eye exam recently - recommended Dr. Dione Booze, Ophthalmologist and provided contact information.   On exam, there is some fluid behind his right TM, c/w right serous otitis media- mild and he does note some post-nasal drainage, which he attributes to Seasonal Allergies. Do not think this needs antibiotic treatment.  Bilateral Sensorineural Hearing Loss seen by Dr. Jenne Pane in July 2020. He had significant hearing loss in both ears and hearing aids were recommended.   History of Smoking 1 PPD x 40 years but has quit.   Blood Pressure: normotensive today at 136/86.  Hyperlipidemia 01/06/2024 Lipid Panel, compared 01/2023: Cholesterol 221, elevated from 212; LDL 141, elevated from 125.  Colonoscopy 02/21/2022. Two 3-5 mm Polyps removed from descending, ascending colon (one serrated mucosal polyp, one tubular adenoma per pathology); Diverticulosis in sigmoid colon; Sigmoid stenosis; otherwise normal with recommended repeat in 2028.  PSA  0.65  01/31/2024  Vaccine Counseling: Due for Flu, Covid-19, and Shingles 1/2; UTD on PNA and Tdap.   Annual wellness visit done today including the all of the following: Reviewed patient's Family Medical History Reviewed and updated list of patient's medical providers Assessment of cognitive impairment was done Assessed patient's functional ability Established a written schedule for health screening services Health Risk Assessent Completed and Reviewed  Discussed health benefits of physical activity, and encouraged him to engage in regular  exercise appropriate for his age and condition.    I,Emily Lagle,acting as a Neurosurgeon for Margaree Mackintosh, MD.,have documented all relevant documentation on the behalf of Margaree Mackintosh, MD,as directed by  Margaree Mackintosh, MD while in the presence of Margaree Mackintosh, MD.   I, Margaree Mackintosh, MD, have reviewed all documentation for this visit. The documentation on 02/03/24 for the exam, diagnosis, procedures, and orders are all accurate and complete.

## 2024-01-31 NOTE — Progress Notes (Signed)
 Lab only

## 2024-02-01 ENCOUNTER — Ambulatory Visit (INDEPENDENT_AMBULATORY_CARE_PROVIDER_SITE_OTHER): Payer: Medicare HMO | Admitting: Internal Medicine

## 2024-02-01 ENCOUNTER — Encounter: Payer: Self-pay | Admitting: Internal Medicine

## 2024-02-01 VITALS — BP 136/86 | HR 71 | Temp 98.8°F | Resp 12 | Ht 69.0 in | Wt 175.1 lb

## 2024-02-01 DIAGNOSIS — E7800 Pure hypercholesterolemia, unspecified: Secondary | ICD-10-CM

## 2024-02-01 DIAGNOSIS — H903 Sensorineural hearing loss, bilateral: Secondary | ICD-10-CM

## 2024-02-01 DIAGNOSIS — H539 Unspecified visual disturbance: Secondary | ICD-10-CM | POA: Diagnosis not present

## 2024-02-01 DIAGNOSIS — Z0000 Encounter for general adult medical examination without abnormal findings: Secondary | ICD-10-CM

## 2024-02-01 DIAGNOSIS — Z87891 Personal history of nicotine dependence: Secondary | ICD-10-CM | POA: Diagnosis not present

## 2024-02-01 DIAGNOSIS — Z1211 Encounter for screening for malignant neoplasm of colon: Secondary | ICD-10-CM

## 2024-02-01 LAB — CBC WITH DIFFERENTIAL/PLATELET
Absolute Lymphocytes: 1602 {cells}/uL (ref 850–3900)
Absolute Monocytes: 426 {cells}/uL (ref 200–950)
Basophils Absolute: 52 {cells}/uL (ref 0–200)
Basophils Relative: 1 %
Eosinophils Absolute: 172 {cells}/uL (ref 15–500)
Eosinophils Relative: 3.3 %
HCT: 47.7 % (ref 38.5–50.0)
Hemoglobin: 16.3 g/dL (ref 13.2–17.1)
MCH: 31.6 pg (ref 27.0–33.0)
MCHC: 34.2 g/dL (ref 32.0–36.0)
MCV: 92.4 fL (ref 80.0–100.0)
MPV: 10.2 fL (ref 7.5–12.5)
Monocytes Relative: 8.2 %
Neutro Abs: 2948 {cells}/uL (ref 1500–7800)
Neutrophils Relative %: 56.7 %
Platelets: 209 10*3/uL (ref 140–400)
RBC: 5.16 10*6/uL (ref 4.20–5.80)
RDW: 11.9 % (ref 11.0–15.0)
Total Lymphocyte: 30.8 %
WBC: 5.2 10*3/uL (ref 3.8–10.8)

## 2024-02-01 LAB — COMPLETE METABOLIC PANEL WITHOUT GFR
AG Ratio: 1.5 (calc) (ref 1.0–2.5)
ALT: 15 U/L (ref 9–46)
AST: 15 U/L (ref 10–35)
Albumin: 4.1 g/dL (ref 3.6–5.1)
Alkaline phosphatase (APISO): 76 U/L (ref 35–144)
BUN: 15 mg/dL (ref 7–25)
CO2: 29 mmol/L (ref 20–32)
Calcium: 9.8 mg/dL (ref 8.6–10.3)
Chloride: 101 mmol/L (ref 98–110)
Creat: 1.13 mg/dL (ref 0.70–1.28)
Globulin: 2.8 g/dL (ref 1.9–3.7)
Glucose, Bld: 96 mg/dL (ref 65–99)
Potassium: 5 mmol/L (ref 3.5–5.3)
Sodium: 137 mmol/L (ref 135–146)
Total Bilirubin: 0.7 mg/dL (ref 0.2–1.2)
Total Protein: 6.9 g/dL (ref 6.1–8.1)

## 2024-02-01 LAB — HEMOCCULT GUIAC POC 1CARD (OFFICE): Fecal Occult Blood, POC: NEGATIVE

## 2024-02-01 LAB — LIPID PANEL
Cholesterol: 221 mg/dL — ABNORMAL HIGH (ref <200)
HDL: 56 mg/dL (ref >=40)
LDL Cholesterol (Calc): 141 mg/dL — ABNORMAL HIGH
Non-HDL Cholesterol (Calc): 165 mg/dL — ABNORMAL HIGH (ref <130)
Total CHOL/HDL Ratio: 3.9 (calc) (ref <5.0)
Triglycerides: 119 mg/dL (ref <150)

## 2024-02-01 LAB — POCT URINALYSIS DIP (MANUAL ENTRY)
Bilirubin, UA: NEGATIVE
Blood, UA: NEGATIVE
Glucose, UA: NEGATIVE mg/dL
Ketones, POC UA: NEGATIVE mg/dL
Leukocytes, UA: NEGATIVE
Nitrite, UA: NEGATIVE
Protein Ur, POC: NEGATIVE mg/dL
Spec Grav, UA: 1.01 (ref 1.010–1.025)
Urobilinogen, UA: NEGATIVE U/dL — AB
pH, UA: 7 (ref 5.0–8.0)

## 2024-02-01 LAB — PSA: PSA: 0.65 ng/mL (ref <=4.00)

## 2024-02-03 NOTE — Patient Instructions (Addendum)
  Please call Opthalmologist about visual disturbance.  Labs are stable. Follow up in one year or as needed.

## 2024-02-12 DIAGNOSIS — D692 Other nonthrombocytopenic purpura: Secondary | ICD-10-CM | POA: Diagnosis not present

## 2024-02-12 DIAGNOSIS — C490 Malignant neoplasm of connective and soft tissue of head, face and neck: Secondary | ICD-10-CM | POA: Diagnosis not present

## 2024-02-12 DIAGNOSIS — S50811A Abrasion of right forearm, initial encounter: Secondary | ICD-10-CM | POA: Diagnosis not present

## 2024-02-12 DIAGNOSIS — L570 Actinic keratosis: Secondary | ICD-10-CM | POA: Diagnosis not present

## 2024-02-12 DIAGNOSIS — L814 Other melanin hyperpigmentation: Secondary | ICD-10-CM | POA: Diagnosis not present

## 2024-02-12 DIAGNOSIS — Z85828 Personal history of other malignant neoplasm of skin: Secondary | ICD-10-CM | POA: Diagnosis not present

## 2024-02-12 DIAGNOSIS — L821 Other seborrheic keratosis: Secondary | ICD-10-CM | POA: Diagnosis not present

## 2024-05-12 DIAGNOSIS — L989 Disorder of the skin and subcutaneous tissue, unspecified: Secondary | ICD-10-CM | POA: Diagnosis not present

## 2024-10-06 ENCOUNTER — Ambulatory Visit: Admitting: Internal Medicine

## 2024-10-06 ENCOUNTER — Encounter: Payer: Self-pay | Admitting: Internal Medicine

## 2024-10-06 VITALS — BP 150/90 | HR 66 | Ht 69.0 in | Wt 173.0 lb

## 2024-10-06 DIAGNOSIS — Z87820 Personal history of traumatic brain injury: Secondary | ICD-10-CM

## 2024-10-06 DIAGNOSIS — R4586 Emotional lability: Secondary | ICD-10-CM

## 2024-10-06 DIAGNOSIS — R454 Irritability and anger: Secondary | ICD-10-CM | POA: Diagnosis not present

## 2024-10-06 DIAGNOSIS — E7800 Pure hypercholesterolemia, unspecified: Secondary | ICD-10-CM | POA: Diagnosis not present

## 2024-10-06 DIAGNOSIS — F17210 Nicotine dependence, cigarettes, uncomplicated: Secondary | ICD-10-CM | POA: Diagnosis not present

## 2024-10-06 DIAGNOSIS — Z0000 Encounter for general adult medical examination without abnormal findings: Secondary | ICD-10-CM

## 2024-10-06 DIAGNOSIS — I10 Essential (primary) hypertension: Secondary | ICD-10-CM | POA: Diagnosis not present

## 2024-10-06 DIAGNOSIS — Z818 Family history of other mental and behavioral disorders: Secondary | ICD-10-CM | POA: Diagnosis not present

## 2024-10-06 DIAGNOSIS — R413 Other amnesia: Secondary | ICD-10-CM | POA: Diagnosis not present

## 2024-10-06 NOTE — Progress Notes (Signed)
 Patient Care Team: Perri Ronal PARAS, MD as PCP - General (Internal Medicine)  Visit Date: 10/06/24  Subjective:    Patient ID: Charles Velazquez , Male   DOB: 1950-12-23, 73 y.o.    MRN: [id number]   73 y.o. Male presents today for mood changes. Patient has a past medical history of  GERD, Right Pelvis Fracture, Pneumonia.   His wife has noticed that he loses his temper frequently. Sometimes it seems to be minor issues that irritate him. It happens daily, sometimes several times a day. This is concerning to her. He has a family history of dementia.   History of hypertension. Blood pressure today elevated at 150/90.    History of elevated cholesterol. Not interested in statin medications.    History of GERD, vasectomy.    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  Plays golf.  He is a native of [place]  but was raised in [place]  and attended high school in [place] before moving to [place] .  He moved to [place] in 1978 with wife and 15-year-old daughter.  Longstanding history of heavy smoking a pack a day for well over 40 years, but has quit.  He drinks beer.  He is married.       Family history: Sister perhaps with diagnosis of breast cancer evaluation patient is not exactly sure.  Mother with history of dementia.  Father with history of vertebroplasty.  1 brother with history of gout.  Another sister in good health.  3 adult daughters in good health.        Patient has history of elevated LDL.  Does not want to be on statin medication.  PSA is normal.      Review of Systems  Psychiatric/Behavioral:  Positive for memory loss.        Mood changes.        Objective:   Vitals: BP (!) 150/90   Pulse 66   Ht 5' 9 (1.753 m)   Wt 173 lb (78.5 kg)   SpO2 97%   BMI 25.55 kg/m    Physical Exam Vitals and nursing note reviewed. Exam conducted with a chaperone present (Araceli Cano Martin Pena, CMA).  Constitutional:      General: He is awake. He is not in acute distress.    Appearance: Normal appearance. He is not ill-appearing or toxic-appearing.  HENT:     Head: Normocephalic and atraumatic.     Right Ear: Tympanic membrane, ear canal and external ear normal.     Left Ear: Tympanic membrane, ear canal and external ear normal.     Mouth/Throat:     Pharynx: Oropharynx is clear.  Eyes:     Extraocular Movements: Extraocular movements intact.     Pupils: Pupils are equal, round, and reactive to light.  Neck:     Thyroid:  No thyroid mass, thyromegaly or thyroid tenderness.     Vascular: No carotid bruit.  Cardiovascular:     Rate and Rhythm: Normal rate and regular rhythm. No extrasystoles are present.    Pulses:          Dorsalis pedis pulses are 2+ on the right side and 2+ on the left side.       Posterior tibial pulses are 2+ on the right side and 2+ on the left side.     Heart sounds: Normal heart sounds. No murmur heard.    No friction rub. No gallop.  Pulmonary:     Effort: Pulmonary effort is normal.     Breath sounds: Normal breath sounds. No decreased breath sounds, wheezing, rhonchi or rales.  Chest:     Chest wall: No mass.  Abdominal:     Palpations: Abdomen is soft. There is no hepatomegaly, splenomegaly or mass.     Tenderness: There is no abdominal tenderness.     Hernia: No hernia is present.  Genitourinary:    Prostate: Normal. Not enlarged, not tender and no nodules present.     Rectum: Normal. Guaiac result  negative.  Musculoskeletal:     Cervical back: Normal range of motion.     Right lower leg: No edema.     Left lower leg: No edema.  Lymphadenopathy:     Cervical: No cervical adenopathy.     Upper Body:     Right upper body: No supraclavicular adenopathy.     Left upper body: No supraclavicular adenopathy.  Skin:    General: Skin is warm and dry.  Neurological:     General: No focal deficit present.     Mental Status: He is alert and oriented to person, place, and time. Mental status is at baseline.     Cranial Nerves: Cranial nerves 2-12 are intact.     Sensory: Sensation is intact.     Motor: Motor function is intact.     Coordination: Coordination is intact.     Gait: Gait is intact.     Deep Tendon Reflexes: Reflexes are normal and symmetric.  Psychiatric:        Attention and Perception: Attention normal.        Mood and Affect: Mood normal.        Speech: Speech normal.        Behavior: Behavior normal. Behavior is cooperative.        Thought Content: Thought content normal.        Cognition and Memory: Cognition and memory normal.        Judgment: Judgment normal.       Results:   Studies obtained and personally reviewed by me:   Labs:       Component Value Date/Time   NA 137 01/31/2024 0923   K 5.0 01/31/2024 0923   CL 101 01/31/2024 0923   CO2 29 01/31/2024 0923   GLUCOSE 96 01/31/2024 0923   BUN 15 01/31/2024 0923   CREATININE 1.13 01/31/2024 0923   CALCIUM 9.8 01/31/2024 0923   PROT 6.9 01/31/2024 0923   ALBUMIN 3.6 12/25/2014 1113   AST 15 01/31/2024 0923   ALT 15 01/31/2024 0923   ALKPHOS 73 12/25/2014 1113   BILITOT 0.7 01/31/2024 0923   GFRNONAA 70 01/14/2021 0908   GFRAA 81 01/14/2021 0908     Lab Results  Component Value Date   WBC 5.2 01/31/2024   HGB 16.3 01/31/2024   HCT 47.7 01/31/2024  MCV 92.4 01/31/2024   PLT 209 01/31/2024    Lab Results  Component Value Date   CHOL 221 (H) 01/31/2024   HDL 56 01/31/2024   LDLCALC 141  (H) 01/31/2024   TRIG 119 01/31/2024   CHOLHDL 3.9 01/31/2024       Lab Results  Component Value Date   PSA 0.65 01/31/2024   PSA 0.63 01/29/2023   PSA 0.80 01/13/2022     Assessment & Plan:   Orders Placed This Encounter  Procedures   CT CHEST LUNG CA SCREEN LOW DOSE W/O CM    Preferred Imaging Location?:   [facility]   MR Brain W Wo Contrast    Standing Status:   Future    Expiration Date:   10/06/2025    If indicated for the ordered procedure, I authorize the administration of contrast media per Radiology protocol:   Yes    What is the patient's sedation requirement?:   No Sedation    Does the patient have a pacemaker or implanted devices?:   No    Preferred imaging location?:   [facility] (table limit-550lbs)   CBC with Differential/Platelet   Comprehensive metabolic panel with GFR   TSH   B12 and Folate Panel   T4, free   Ambulatory referral to Neuropsychology    Referral Priority:   Urgent    Referral Type:   Psychiatric    Referral Reason:   Specialty Services Required    Referred to Provider:   Corina Norleen SAUNDERS, PsyD    Requested Specialty:   Psychology    Number of Visits Requested:   1    Irritability and anger: His wife has noticed that he loses his temper frequently. Sometimes it seems to be minor issues that irritate him. It happens daily, sometimes several times a day. This is concerning to her. He has a family history of dementia.  Brain MRI ordered.   CBC, CMP, TSH, B12 and Folate Panel, Free T4 ordered.   Referred to Norleen Corina in psychology  Referral to Neurology  Hypertension: Blood pressure today elevated at 150/90. Continued to monitor. Pt anxious today. Does not feel he has a problem but his wife does feel he needs evaluation for irritability and anger.   Elevated cholesterol: Not interested in statin medications.    History of GERD, vasectomy.    History of smoking.   CT lung cancer screening ordered.    I,Makayla C  Reid,acting as a scribe for [name], MD.,have documented all relevant documentation on the behalf of [name], MD,as directed by  [name], MD while in the presence of [name], MD.   I, [name], MD, have reviewed all documentation for this visit. The documentation on 10/06/2024 for the exam, diagnosis, procedures, and orders are all accurate and complete.

## 2024-10-07 ENCOUNTER — Ambulatory Visit: Payer: Self-pay | Admitting: Internal Medicine

## 2024-10-07 LAB — COMPREHENSIVE METABOLIC PANEL WITH GFR
AG Ratio: 1.5 (calc) (ref 1.0–2.5)
ALT: 16 U/L (ref 9–46)
AST: 18 U/L (ref 10–35)
Albumin: 4.3 g/dL (ref 3.6–5.1)
Alkaline phosphatase (APISO): 73 U/L (ref 35–144)
BUN: 23 mg/dL (ref 7–25)
CO2: 27 mmol/L (ref 20–32)
Calcium: 10.1 mg/dL (ref 8.6–10.3)
Chloride: 104 mmol/L (ref 98–110)
Creat: 1.27 mg/dL (ref 0.70–1.28)
Globulin: 2.8 g/dL (ref 1.9–3.7)
Glucose, Bld: 86 mg/dL (ref 65–99)
Potassium: 4.3 mmol/L (ref 3.5–5.3)
Sodium: 139 mmol/L (ref 135–146)
Total Bilirubin: 0.5 mg/dL (ref 0.2–1.2)
Total Protein: 7.1 g/dL (ref 6.1–8.1)
eGFR: 60 mL/min/1.73m2 (ref >=60)

## 2024-10-07 LAB — CBC WITH DIFFERENTIAL/PLATELET
Absolute Lymphocytes: 1841 {cells}/uL (ref 850–3900)
Absolute Monocytes: 588 {cells}/uL (ref 200–950)
Basophils Absolute: 42 {cells}/uL (ref 0–200)
Basophils Relative: 0.6 %
Eosinophils Absolute: 210 {cells}/uL (ref 15–500)
Eosinophils Relative: 3 %
HCT: 47.7 % (ref 39.4–51.1)
Hemoglobin: 16.2 g/dL (ref 13.2–17.1)
MCH: 31.6 pg (ref 27.0–33.0)
MCHC: 34 g/dL (ref 31.6–35.4)
MCV: 93.2 fL (ref 81.4–101.7)
MPV: 10.7 fL (ref 7.5–12.5)
Monocytes Relative: 8.4 %
Neutro Abs: 4319 {cells}/uL (ref 1500–7800)
Neutrophils Relative %: 61.7 %
Platelets: 216 Thousand/uL (ref 140–400)
RBC: 5.12 Million/uL (ref 4.20–5.80)
RDW: 12.1 % (ref 11.0–15.0)
Total Lymphocyte: 26.3 %
WBC: 7 Thousand/uL (ref 3.8–10.8)

## 2024-10-07 LAB — T4, FREE: Free T4: 1.3 ng/dL (ref 0.8–1.8)

## 2024-10-07 LAB — B12 AND FOLATE PANEL
Folate: 7.6 ng/mL
Vitamin B-12: 340 pg/mL (ref 200–1100)

## 2024-10-07 LAB — TSH: TSH: 1.62 m[IU]/L (ref 0.40–4.50)

## 2024-10-10 NOTE — Patient Instructions (Addendum)
 Patient is being referred for Neuropsychological testing with Dr. Corina. MRI of brain ordered. Labs drawn and results are pending. Checking thyroid functions, B12 and folate levels. Low dose lung cancer CT screen ordered.MRI of brain ordered. Should also see Neurologist.Neurologist.

## 2024-10-14 ENCOUNTER — Encounter: Payer: Self-pay | Admitting: Physician Assistant

## 2024-10-21 ENCOUNTER — Ambulatory Visit
Admission: RE | Admit: 2024-10-21 | Discharge: 2024-10-21 | Disposition: A | Source: Ambulatory Visit | Attending: Internal Medicine | Admitting: Internal Medicine

## 2024-10-21 DIAGNOSIS — F17210 Nicotine dependence, cigarettes, uncomplicated: Secondary | ICD-10-CM | POA: Diagnosis not present

## 2024-11-11 ENCOUNTER — Encounter: Payer: Self-pay | Admitting: Internal Medicine

## 2024-11-15 ENCOUNTER — Inpatient Hospital Stay: Admission: RE | Admit: 2024-11-15 | Discharge: 2024-11-15 | Attending: Internal Medicine | Admitting: Internal Medicine

## 2024-11-15 DIAGNOSIS — Z87820 Personal history of traumatic brain injury: Secondary | ICD-10-CM

## 2024-11-15 MED ORDER — GADOPICLENOL 0.5 MMOL/ML IV SOLN
7.5000 mL | Freq: Once | INTRAVENOUS | Status: AC | PRN
  Administered 2024-11-15: 7.5 mL via INTRAVENOUS

## 2025-01-05 ENCOUNTER — Encounter

## 2025-01-05 ENCOUNTER — Encounter: Payer: Self-pay | Admitting: Physician Assistant

## 2025-02-02 ENCOUNTER — Other Ambulatory Visit: Payer: Self-pay

## 2025-02-03 ENCOUNTER — Ambulatory Visit: Payer: Self-pay | Admitting: Internal Medicine
